# Patient Record
Sex: Female | Born: 1970
Health system: Southern US, Community
[De-identification: ages and names within clinical notes are randomized; demographics above are authoritative.]

## PROBLEM LIST (undated history)

## (undated) DIAGNOSIS — Z8619 Personal history of other infectious and parasitic diseases: Secondary | ICD-10-CM

## (undated) DIAGNOSIS — T7840XA Allergy, unspecified, initial encounter: Secondary | ICD-10-CM

## (undated) DIAGNOSIS — D649 Anemia, unspecified: Secondary | ICD-10-CM

## (undated) DIAGNOSIS — R112 Nausea with vomiting, unspecified: Secondary | ICD-10-CM

## (undated) DIAGNOSIS — Z9889 Other specified postprocedural states: Secondary | ICD-10-CM

## (undated) DIAGNOSIS — R011 Cardiac murmur, unspecified: Secondary | ICD-10-CM

## (undated) DIAGNOSIS — R569 Unspecified convulsions: Secondary | ICD-10-CM

## (undated) HISTORY — PX: OTHER SURGICAL HISTORY: SHX169

## (undated) HISTORY — DX: Allergy, unspecified, initial encounter: T78.40XA

## (undated) HISTORY — DX: Anemia, unspecified: D64.9

## (undated) HISTORY — PX: COSMETIC SURGERY: SHX468

---

## 1993-10-25 DIAGNOSIS — Z8619 Personal history of other infectious and parasitic diseases: Secondary | ICD-10-CM

## 1993-10-25 HISTORY — DX: Personal history of other infectious and parasitic diseases: Z86.19

## 2000-09-20 ENCOUNTER — Other Ambulatory Visit: Admission: RE | Admit: 2000-09-20 | Discharge: 2000-09-20 | Payer: Self-pay | Admitting: Family Medicine

## 2001-09-22 ENCOUNTER — Other Ambulatory Visit: Admission: RE | Admit: 2001-09-22 | Discharge: 2001-09-22 | Payer: Self-pay | Admitting: *Deleted

## 2004-11-13 ENCOUNTER — Other Ambulatory Visit: Admission: RE | Admit: 2004-11-13 | Discharge: 2004-11-13 | Payer: Self-pay | Admitting: Family Medicine

## 2005-01-03 ENCOUNTER — Emergency Department (HOSPITAL_COMMUNITY): Admission: EM | Admit: 2005-01-03 | Discharge: 2005-01-03 | Payer: Self-pay | Admitting: Emergency Medicine

## 2005-09-20 ENCOUNTER — Other Ambulatory Visit: Admission: RE | Admit: 2005-09-20 | Discharge: 2005-09-20 | Payer: Self-pay | Admitting: Obstetrics and Gynecology

## 2005-10-25 HISTORY — PX: PLACEMENT OF BREAST IMPLANTS: SHX6334

## 2006-04-08 ENCOUNTER — Inpatient Hospital Stay (HOSPITAL_COMMUNITY): Admission: RE | Admit: 2006-04-08 | Discharge: 2006-04-11 | Payer: Self-pay | Admitting: Obstetrics and Gynecology

## 2006-09-02 ENCOUNTER — Encounter: Admission: RE | Admit: 2006-09-02 | Discharge: 2006-09-02 | Payer: Self-pay | Admitting: Obstetrics and Gynecology

## 2007-01-05 ENCOUNTER — Emergency Department (HOSPITAL_COMMUNITY): Admission: EM | Admit: 2007-01-05 | Discharge: 2007-01-05 | Payer: Self-pay | Admitting: Emergency Medicine

## 2007-11-02 ENCOUNTER — Encounter: Admission: RE | Admit: 2007-11-02 | Discharge: 2007-11-02 | Payer: Self-pay | Admitting: Obstetrics and Gynecology

## 2009-01-06 ENCOUNTER — Encounter: Admission: RE | Admit: 2009-01-06 | Discharge: 2009-01-06 | Payer: Self-pay | Admitting: Obstetrics and Gynecology

## 2010-01-27 ENCOUNTER — Encounter: Admission: RE | Admit: 2010-01-27 | Discharge: 2010-01-27 | Payer: Self-pay | Admitting: Obstetrics and Gynecology

## 2010-06-08 ENCOUNTER — Emergency Department (HOSPITAL_COMMUNITY): Admission: EM | Admit: 2010-06-08 | Discharge: 2010-06-08 | Payer: Self-pay | Admitting: Emergency Medicine

## 2011-01-05 ENCOUNTER — Other Ambulatory Visit: Payer: Self-pay | Admitting: Obstetrics and Gynecology

## 2011-01-05 DIAGNOSIS — Z1231 Encounter for screening mammogram for malignant neoplasm of breast: Secondary | ICD-10-CM

## 2011-01-07 LAB — POCT I-STAT, CHEM 8
Hemoglobin: 13.9 g/dL (ref 12.0–15.0)
Potassium: 4.3 mEq/L (ref 3.5–5.1)

## 2011-01-29 ENCOUNTER — Ambulatory Visit: Payer: Self-pay

## 2011-02-17 ENCOUNTER — Encounter (INDEPENDENT_AMBULATORY_CARE_PROVIDER_SITE_OTHER): Payer: BC Managed Care – PPO | Admitting: Vascular Surgery

## 2011-02-17 DIAGNOSIS — I781 Nevus, non-neoplastic: Secondary | ICD-10-CM

## 2011-02-17 DIAGNOSIS — I83893 Varicose veins of bilateral lower extremities with other complications: Secondary | ICD-10-CM

## 2011-02-18 NOTE — Consult Note (Signed)
NEW PATIENT CONSULTATION  Sara [name], Sara [name] DOB:  10-01-1971                                       02/17/2011 [id number]#:[id number]  Patient presents today for evaluation of her lower extremity pathology. She is a very active, healthy 40 year old white female who was concerned regarding reticular veins over her pretibial area and also her lateral calf.  She is quite active from an exercise standpoint and does report cramping, mostly in her left calf and left foot after a workout during the day and the night of that event.  I explained that these night muscle cramps are not related to venous pathology.  She does not have any history of DVT or other difficulty.  SOCIAL HISTORY:  She is married with 1 child.  She works in Education officer, environmental. She does not smoke, has never smoked, and does have a glass of wine 3 days per week.  FAMILY HISTORY:  Negative for premature atherosclerotic disease.  REVIEW OF SYSTEMS:  No weight loss or gain.  She weighs 135 pounds.  She is 5 feet 6 inches tall.  Review of systems is otherwise completely negative.  MEDICATIONS:  Birth control pill.  ALLERGIES:  Sulfa.  PHYSICAL EXAMINATION:  A well-developed and well-nourished white female appearing stated age in no acute distress.  Blood pressure is 127/65, pulse 47, respirations 16.  HEENT:  Normal.  Her dorsalis pedis pulses are 2+ bilaterally.  Musculoskeletal shows no major deformity or cyanosis.  Neurologic:  No focal paresthesias.  Skin without ulcers or rashes.  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  She has a few scattered typical telangiectasia over her thighs.  I imaged these areas with the SonoSite for screening, and this shows a normal to small size saphenous vein with no reflux.  She does have obvious reticular veins under the area in her medial calf where she does have the  discoloration.  I discussed the significance of this with patient.  I explained that this should not pose any prolonged risk from a general health standpoint.  I did explain the treatment options for foam sclerotherapy to these areas of reticular varicosities.  I explained that we would recommend this in the fall since it would typically look worse before it looks better and would ask her to wear compression garments following this as well, which is uncomfortable in the summer time.  She understands and will contact us should she wish to proceed with treatment.    Larina Earthly, M.D. Electronically Signed  TFE/MEDQ  D:  02/17/2011  T:  02/18/2011  Job:  [id number]  cc:   Amy Y. [place], M.D. Michelle L. Vincente Poli, M.D.

## 2011-03-04 ENCOUNTER — Ambulatory Visit: Payer: Self-pay

## 2011-03-12 NOTE — Op Note (Signed)
Sara [name], Sara [name]                ACCOUNT NO.:  [id number]   MEDICAL RECORD NO.:  [id number]          PATIENT TYPE:  INP   LOCATION:  9199                          FACILITY:  [facility]   PHYSICIAN:  Michelle L. Grewal, M.D.DATE OF BIRTH:  May 09, 1971   DATE OF PROCEDURE:  04/08/2006  DATE OF DISCHARGE:                                 OPERATIVE REPORT   PREOPERATIVE DIAGNOSIS:  Intrauterine pregnancy at 39 weeks and breech  presentation.   POSTOPERATIVE DIAGNOSIS:  Intrauterine pregnancy at 39 weeks and breech  presentation.   PROCEDURE:  Primary low transverse cesarean section.   SURGEON:  Michelle L. Vincente Poli, M.D.   ANESTHESIA:  Spinal.   SPECIMENS:  Female infant.  Apgars 9 at one minute and 9 at five minutes.  Frank breech presentation.   ESTIMATED BLOOD LOSS:  500 cc.   COMPLICATIONS:  None.   DESCRIPTION OF PROCEDURE:  The patient was taken to the operating room.  She  was then given her spinal without incident.  She was prepped and draped in  the usual sterile fashion.  A Foley catheter was inserted.   A low transverse incision was made and carried down into the fascia.  The  fascia was scored in the midline and extended laterally.  The rectus muscles  were separated in the midline, and the peritoneum was entered bluntly.  The  peritoneal incision was then stretched.  The bladder blade was inserted.  The lower uterine segment was identified.  The bladder flap was created  sharply and then digitally.  The bladder blade was then readjusted.  A low  transverse incision was made in the uterus.  The uterus was entered using a  hemostat.  The baby was in frank breech presentation.  I then easily grasped  both feet and then performed a complete breech extraction without any  difficulty.  There were double nuchal arms noted.  The baby was very  vigorous on the abdomen.  It was a female infant.  Apgars were 9 at one  minute and 9 at five minutes.  The cord was clamped and cut.  The  baby was  handed to the awaiting pediatricians.  The placenta was manually removed and  noted to be normal and intact with a three-vessel cord.  The uterus was  exteriorized and cleared of all clots and debris.  The uterine incision was  closed in one layer using 0 chromic in continuous running locked stitch.  The uterus was returned to the abdomen.  Irrigation was performed.  The  peritoneum and the rectus muscles were reapproximated in the midline using 0  Vicryl.  The fascia  was closed using 0 Vicryl in continuous running stitch from one end to the  other.  After irrigation of the subcutaneous layer, the skin was closed with  staples.  All sponge, lap, and instrument counts were correct x2.   The patient went to the recovery room in stable condition.      Michelle L. Vincente Poli, M.D.  Electronically Signed     MLG/MEDQ  D:  04/08/2006  T:  04/08/2006  Job:  [id number]

## 2011-03-12 NOTE — Discharge Summary (Signed)
[name], [name]                ACCOUNT NO.:  [id number]   MEDICAL RECORD NO.:  [id number]          PATIENT TYPE:  INP   LOCATION:  9141                          FACILITY:  [facility]   PHYSICIAN:  Zelphia Cairo, MD    DATE OF BIRTH:  Dec 04, 1970   DATE OF ADMISSION:  04/08/2006  DATE OF DISCHARGE:  04/11/2006                                 DISCHARGE SUMMARY   ADMISSION DATE:  1.  Intrauterine pregnancy at 43 weeks estimated gestational age.  2.  Breech presentation.   DISCHARGE DIAGNOSES:  1.  Status post low transverse cesarean section.  2.  Viable female infant.   PROCEDURE:  Primary low transverse cesarean section.   REASON FOR ADMISSION:  Please see written H&P.   HOSPITAL COURSE:  The patient is 39 year old prima gravida that was admitted  to [facility] at 58 weeks' estimated gestational age for  scheduled cesarean section.  The patient was noted to have a fetus in the  frank breech presentation.  On the morning of admission, the patient was  taken to the operating room where spinal anesthesia was administered without  difficulty.  Low transverse incision was made with delivery of a viable  female infant weighing 7 pounds 14 ounces, Apgars of 9 at one minute and 9  at five minutes.  The patient tolerated procedure well and was taken to the  recovery room in stable condition.  On postoperative day #1, the patient was  without complaint.  Vital signs were stable.  She was afebrile.  Fundus firm  and nontender.  Abdominal dressing was noted to be clean, dry and intact.  On postoperative day #2, the patient was without complaint.  She was breast-  feeding well.  Vital signs were stable.  She was afebrile.  Fundus firm and  nontender.  Abdominal dressing had been removed revealing an incision that  is clean, dry and intact.  The patient is ambulating well and tolerating a  regular diet without complaints of nausea and vomiting. On postoperative day  #3, the  patient was without complaint.  Vital signs were stable.  She was  afebrile.  Fundus firm and nontender.  Incision was clean, dry and intact.  Discharge instructions reviewed.  Staples were removed and the patient was  later discharged home   CONDITION ON DISCHARGE:  Good.   DIET:  Regular as tolerated.   ACTIVITY:  No heavy lifting, no driving x2 weeks, no vaginal entry.   FOLLOWUP:  The patient is to follow up in the office in 1-2 weeks for an  incision check.  She is to call for temperature greater than 100 degrees,  persistent nausea and vomiting, heavy vaginal bleeding and/or redness or  drainage from incisional site.   DISCHARGE MEDICATIONS:  1.  Percocet 5/325 #30 one p.o. every 4-6 hours p.r.n.  2.  Motrin 600 mg every six hours.  3.  Prenatal vitamins one p.o. daily.  4.  Colace one p.o. daily p.r.n.      Julio Sicks, N.P.      Zelphia Cairo, MD  Electronically Signed  CC/MEDQ  D:  05/19/2006  T:  05/19/2006  Job:  [id number]

## 2011-04-19 ENCOUNTER — Ambulatory Visit
Admission: RE | Admit: 2011-04-19 | Discharge: 2011-04-19 | Disposition: A | Discharge status: Home / Self Care | Payer: BC Managed Care – PPO | Source: Ambulatory Visit | Attending: Obstetrics and Gynecology | Admitting: Obstetrics and Gynecology

## 2011-04-19 DIAGNOSIS — Z1231 Encounter for screening mammogram for malignant neoplasm of breast: Secondary | ICD-10-CM

## 2012-03-15 ENCOUNTER — Other Ambulatory Visit: Payer: Self-pay | Admitting: Obstetrics and Gynecology

## 2012-03-15 DIAGNOSIS — Z1231 Encounter for screening mammogram for malignant neoplasm of breast: Secondary | ICD-10-CM

## 2012-04-28 ENCOUNTER — Ambulatory Visit
Admission: RE | Admit: 2012-04-28 | Discharge: 2012-04-28 | Disposition: A | Discharge status: Home / Self Care | Payer: BC Managed Care – PPO | Source: Ambulatory Visit | Attending: Obstetrics and Gynecology | Admitting: Obstetrics and Gynecology

## 2012-04-28 DIAGNOSIS — Z1231 Encounter for screening mammogram for malignant neoplasm of breast: Secondary | ICD-10-CM

## 2012-05-24 ENCOUNTER — Telehealth: Payer: Self-pay | Admitting: *Deleted

## 2012-05-24 NOTE — Telephone Encounter (Signed)
Confirmed 07/17/12 genetic appt w/ pt.  Called [place] at referring to make aware.  Took paperwork to [place].

## 2012-07-17 ENCOUNTER — Encounter: Payer: Self-pay | Admitting: Genetic Counselor

## 2012-07-17 ENCOUNTER — Other Ambulatory Visit: Payer: BC Managed Care – PPO | Admitting: Lab

## 2012-07-17 ENCOUNTER — Ambulatory Visit (HOSPITAL_BASED_OUTPATIENT_CLINIC_OR_DEPARTMENT_OTHER): Payer: BC Managed Care – PPO | Admitting: Genetic Counselor

## 2012-07-17 DIAGNOSIS — Z806 Family history of leukemia: Secondary | ICD-10-CM

## 2012-07-17 DIAGNOSIS — Z800 Family history of malignant neoplasm of digestive organs: Secondary | ICD-10-CM

## 2012-07-17 DIAGNOSIS — Z8041 Family history of malignant neoplasm of ovary: Secondary | ICD-10-CM

## 2012-07-17 DIAGNOSIS — Z803 Family history of malignant neoplasm of breast: Secondary | ICD-10-CM

## 2012-07-17 NOTE — Progress Notes (Signed)
Dr.  Marcelle [name] requested a consultation for genetic counseling and risk assessment for Sara [name], a 41 y.o. female, for discussion of her family history of breast and ovarian cancer. She presents to clinic today to discuss the possibility of a genetic predisposition to cancer, and to further clarify her risks, as well as her family members' risks for cancer.   HISTORY OF PRESENT ILLNESS: Sara [name] is a 41 y.o. female with no personal history of cancer.    History reviewed. No pertinent past medical history.  History reviewed. No pertinent past surgical history.  History  Substance Use Topics  . Smoking status: Never Smoker   . Smokeless tobacco: Not on file  . Alcohol Use: Not on file    REPRODUCTIVE HISTORY AND PERSONAL RISK ASSESSMENT FACTORS: Menarche was at age 56.   Premenopausal Uterus Intact: Yes Ovaries Intact: Yes G1P1A0 , first live birth at age 69  She has not previously undergone treatment for infertility.   OCP use for 20 years   She has not used HRT in the past.    FAMILY HISTORY:  We obtained a detailed, 4-generation family history.  Significant diagnoses are listed below: Family History  Problem Relation Age of Onset  . Breast cancer Mother 74  . Leukemia Mother     from chemotherapy  . Heart disease Brother     history of drug and ETOH abuse  . Ovarian cancer Maternal Aunt 78  . Leukemia Paternal Grandmother   . Colon cancer Paternal Grandfather     diagnosed in his 7s  The patient's brother died of heart disease at age 58.  He had a hard life of drug and alcohol abuse.  Her mother was diagnosed with breast cancer at age 44, and subsequently was diagnosed with leukemia from her chemotherapy.  The patient's maternal aunt was diagnosed with ovarian cancer at age 110.  There is no other reported cancer history on this side of the family.  The patient's paternal grandmother was diagnosed with leukemia at age 46 and died at age 60.  Her paternal  grandfather was diagnosed in his 71s with colon cancer.  There is no other reported family history of cancer.  Patient's maternal ancestors are of English descent, and paternal ancestors are of unknown descent. There is no reported Ashkenazi Jewish ancestry. There is no  known consanguinity.  GENETIC COUNSELING RISK ASSESSMENT, DISCUSSION, AND SUGGESTED FOLLOW UP: We reviewed the natural history and genetic etiology of sporadic, familial and hereditary cancer syndromes.  About 5-10% of breast cancer is hereditary.  Of this, about 85% is the result of a BRCA1 or BRCA2 mutation.  We reviewed the red flags of hereditary cancer syndromes and the dominant inheritance patterns.  If the BRCA testing is negative, we discussed that we could be testing for the wrong gene.  We discussed gene panels, and that several cancer genes that are associated with different cancers can be tested at the same time.  Because of the different types of cancer that are in the patient's family, we will consider the BreastNext panel test.   The patient's family history of breast and ovarian cancer is suggestive of the following possible diagnosis: hereditary breast cancer syndrome  We discussed that identification of a hereditary cancer syndrome may help her care providers tailor the patients medical management. If a mutation indicating a hereditary cancer syndrome is detected in this case, the [employer] recommendations would include increased cancer surveillance and possible  prophylactic surgery. If a mutation is detected, the patient will be referred back to the referring provider and to any additional appropriate care providers to discuss the relevant options.   If a mutation is not found in the patient, this will not necessarily decrease the likelihood of a hereditary cancer syndrome as the explanation for her family history. Cancer surveillance options would be discussed for the patient according to  the appropriate standard National Comprehensive Cancer Network and American Cancer Society guidelines, with consideration of their personal and family history risk factors. In this case, the patient will be referred back to their care providers for discussions of management.   In order to estimate her chance of having a BRAC1 or BRCA2 mutation, we used statistical models (Penn II and tyrer cusik) and laboratory data that take into account her personal medical history, family history and ancestry.  Because each model is different, there can be a lot of variability in the risks they give.  Therefore, these numbers must be considered a rough range and not a precise risk of having a BRCA1 or BRCA2 mutation.  These models estimate that she has approximately a 0.83-3.0% chance of having a mutation.   Based on the patient's personal and family history, statistical models (tyrer cusik)  and literature data were used to estimate her risk of developing breast cancer. These estimate her lifetime risk of developing breast cancer to be approximately 13%. This estimation does not take into account any genetic testing results.   After considering the risks, benefits, and limitations, the patient provided informed consent for  the following  testing: BreastNext through [employer]   Per the patient's request, we will contact her by telephone to discuss these results. A follow up genetic counseling visit will be scheduled if indicated.  The patient was seen for a total of 60 minutes, greater than 50% of which was spent face-to-face counseling.  This plan is being carried out per Dr. Lynnell Dike recommendations.  This note will also be sent to the referring provider via the electronic medical record. The patient will be supplied with a summary of this genetic counseling discussion as well as educational information on the discussed hereditary cancer syndromes following the conclusion of their visit.   Patient was  discussed with Dr. Drue [name].  EPIC CC: Sara Overlie, MD    _______________________________________________________________________ For Office Staff:  Number of people involved in session: 3 Was an Intern/ student involved with case: yes

## 2012-08-14 ENCOUNTER — Telehealth: Payer: Self-pay | Admitting: Oncology

## 2012-08-14 NOTE — Telephone Encounter (Signed)
LVOM for pt to return call.  °

## 2012-10-10 ENCOUNTER — Telehealth: Payer: Self-pay | Admitting: Genetic Counselor

## 2012-10-10 NOTE — Telephone Encounter (Signed)
Revealed ATM VUS.  Offered family studies.  Patient declined.

## 2012-10-10 NOTE — Telephone Encounter (Signed)
Left message about test results and asked that she CB.

## 2012-10-10 NOTE — Telephone Encounter (Signed)
Mobile number is wrong number!!!!

## 2012-10-11 ENCOUNTER — Encounter: Payer: Self-pay | Admitting: Genetic Counselor

## 2013-04-02 ENCOUNTER — Other Ambulatory Visit: Payer: Self-pay

## 2013-04-02 DIAGNOSIS — Z1231 Encounter for screening mammogram for malignant neoplasm of breast: Secondary | ICD-10-CM

## 2013-05-14 ENCOUNTER — Ambulatory Visit
Admission: RE | Admit: 2013-05-14 | Discharge: 2013-05-14 | Disposition: A | Discharge status: Home / Self Care | Payer: BC Managed Care – PPO | Source: Ambulatory Visit

## 2013-05-14 DIAGNOSIS — Z1231 Encounter for screening mammogram for malignant neoplasm of breast: Secondary | ICD-10-CM

## 2014-04-09 ENCOUNTER — Ambulatory Visit (INDEPENDENT_AMBULATORY_CARE_PROVIDER_SITE_OTHER): Payer: BC Managed Care – PPO | Admitting: General Surgery

## 2014-04-09 ENCOUNTER — Other Ambulatory Visit: Payer: Self-pay

## 2014-04-09 DIAGNOSIS — Z1231 Encounter for screening mammogram for malignant neoplasm of breast: Secondary | ICD-10-CM

## 2014-04-23 ENCOUNTER — Encounter (INDEPENDENT_AMBULATORY_CARE_PROVIDER_SITE_OTHER): Payer: Self-pay | Admitting: General Surgery

## 2014-04-23 ENCOUNTER — Ambulatory Visit (INDEPENDENT_AMBULATORY_CARE_PROVIDER_SITE_OTHER): Payer: BC Managed Care – PPO | Admitting: General Surgery

## 2014-04-23 VITALS — BP 126/76 | HR 75 | Temp 98.8°F | Ht 66.0 in | Wt 125.0 lb

## 2014-04-23 DIAGNOSIS — K439 Ventral hernia without obstruction or gangrene: Secondary | ICD-10-CM

## 2014-04-23 NOTE — Progress Notes (Signed)
Patient ID: Sara [name], female   DOB: June 14, 1971, 43 y.o.   MRN: [id number]  Chief Complaint  Patient presents with  . Umbilical Hernia    HPI Sara [name] is a 43 y.o. female.  Referred by Dr Dian Queen HPI This is a 43 year old otherwise healthy female who presents as a referral for possible ventral hernia. She had a C-section 8 years ago and has noticed in the area above her umbilicus since then. She has been working out and has lost some weight recently. She now complains about a protrusion above her umbilicus when she is exercising. Interestingly this goes away while she is doing crunches. It never causes her any discomfort. Does not limit her in any of her activities. She does describe that there is sometimes up to a golf ball sized area that is present and she can see this. She has had no prior surgery except for her to see her c-section. She comes in today to be evaluated for possible ventral hernia. 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  Past Medical History  Diagnosis Date  . Anemia     Past Surgical History  Procedure Laterality Date  . Placement of breast implants    c/section, bilateral bunionectomy  Family History  Problem Relation Age of Onset  . Breast cancer Mother 80  . Leukemia Mother     from chemotherapy  . Heart disease Brother     history of drug and ETOH abuse  . Ovarian cancer Maternal Aunt 78  . Leukemia Paternal Grandmother   . Colon cancer Paternal Grandfather     diagnosed in his 57s    Social History History  Substance Use Topics  . Smoking status: Never Smoker   . Smokeless tobacco: Not on file  . Alcohol Use: 2.4 oz/week    4 Glasses of wine per week    Allergies  Allergen Reactions  . Sulfa Antibiotics     Current Outpatient Prescriptions  Medication Sig Dispense Refill  . ALPRAZolam (XANAX) 0.25 MG tablet Take 0.25 mg by mouth at bedtime as needed for anxiety.      . norgestrel-ethinyl  estradiol (LO/OVRAL,CRYSELLE) 0.3-30 MG-MCG tablet Take 1 tablet by mouth daily.       No current facility-administered medications for this visit.    Review of Systems Review of Systems  Constitutional: Negative for fever, chills and unexpected weight change.  HENT: Negative for congestion, hearing loss, sore throat, trouble swallowing and voice change.   Eyes: Negative for visual disturbance.  Respiratory: Negative for cough and wheezing.   Cardiovascular: Negative for chest pain, palpitations and leg swelling.  Gastrointestinal: Negative for nausea, vomiting, abdominal pain, diarrhea, constipation, blood in stool, abdominal distention and anal bleeding.  Genitourinary: Negative for hematuria, vaginal bleeding and difficulty urinating.  Musculoskeletal: Negative for arthralgias.  Skin: Negative for rash and wound.  Neurological: Negative for seizures, syncope and headaches.  Hematological: Negative for adenopathy. Does not bruise/bleed easily.  Psychiatric/Behavioral: Negative for confusion.    Blood pressure 126/76, pulse 75, temperature 98.8 F (37.1 C), height 5' 6"  (1.676 m), weight 125 lb (56.7 kg).  Physical Exam Physical Exam  Vitals reviewed. Constitutional: She appears well-developed and well-nourished.  Cardiovascular: Normal rate, regular rhythm and normal heart sounds.   Pulmonary/Chest: Effort normal and breath sounds normal. She has no wheezes. She has no rales.  Abdominal: Soft. Normal appearance and bowel sounds are normal. She exhibits no distension. There is tenderness.  Data Reviewed Dr Gracy Racer notes  Assessment    Possible ventral hernia     Plan    She gives a good history for a ventral hernia. However I can't really demonstrate this on her exam despite multiple maneuvers today to do so. I am pretty sure that she does have a hernia. I do not think she has a diastases. We discussed all of her options including observation, imaging to see if we  can identify a hernia or surgery with laparoscopy to identify possible hernia. We decided after a long conversation to proceed with a CT scan to see if we can identify a hernia. She understands there might be some limitations to this test. I will plan on calling her after obtaining the CT scan and we will proceed from there. I told her there was a hernia we could fix this sometime in the future.        WAKEFIELD,MATTHEW 04/23/2014, 5:11 PM

## 2014-05-13 ENCOUNTER — Ambulatory Visit
Admission: RE | Admit: 2014-05-13 | Discharge: 2014-05-13 | Disposition: A | Discharge status: Home / Self Care | Payer: BC Managed Care – PPO | Source: Ambulatory Visit | Attending: General Surgery | Admitting: General Surgery

## 2014-05-13 DIAGNOSIS — K439 Ventral hernia without obstruction or gangrene: Secondary | ICD-10-CM

## 2014-05-13 MED ORDER — IOHEXOL 300 MG/ML  SOLN
100.0000 mL | Freq: Once | INTRAMUSCULAR | Status: AC | PRN
  Administered 2014-05-13: 100 mL via INTRAVENOUS

## 2014-05-14 ENCOUNTER — Telehealth (INDEPENDENT_AMBULATORY_CARE_PROVIDER_SITE_OTHER): Payer: Self-pay | Admitting: General Surgery

## 2014-05-14 NOTE — Telephone Encounter (Signed)
Has a small supraumbilical hernia on ct scan.  We discussed and I recommended a primary repair.  She is going to discuss with her husband and would like to have this fixed in next few months.  She will call back to make another appt.  If gets worse or more symptoms she will call sooner

## 2014-05-15 ENCOUNTER — Ambulatory Visit
Admission: RE | Admit: 2014-05-15 | Discharge: 2014-05-15 | Disposition: A | Discharge status: Home / Self Care | Payer: BC Managed Care – PPO | Source: Ambulatory Visit

## 2014-05-15 DIAGNOSIS — Z1231 Encounter for screening mammogram for malignant neoplasm of breast: Secondary | ICD-10-CM

## 2014-05-24 ENCOUNTER — Telehealth (INDEPENDENT_AMBULATORY_CARE_PROVIDER_SITE_OTHER): Payer: Self-pay | Admitting: General Surgery

## 2014-05-24 NOTE — Telephone Encounter (Signed)
Patient called in explaining that she would like to wait until December to schedule her surgery.  Preferably she would like to be scheduled on 10/21/14.  Informed her that the OR schedule isn't completed that far out.  Informed her that she will need another appt here in the office before the surgery in December.  She has one in October and I informed her that it would be Dr. Cristal Generous discretion on whether or not it needs to be closer to December or not.  Informed her that I would send this message around to him and his assistant, Lars Mage, to make them aware.  She explained she is also planning to continue with crossfit and weight training.  Informed her that if something hurts then she should avoid doing it.  Educated on bracing the abdomen on heavy lifting exercises and educated her on making sure they hernia can be reducible.  Informed her to call the office if it becomes non-reducible, starts having problems with her bowels, or severe N/V. Patient verbalized understanding.

## 2014-05-26 NOTE — Telephone Encounter (Signed)
She can see me one month before.  She cannot do crossfit or anything strenuous for at least 2 maybe 3 weeks.  Otherwise there is risk of failure

## 2014-05-30 ENCOUNTER — Other Ambulatory Visit: Payer: Self-pay | Admitting: Obstetrics and Gynecology

## 2014-05-30 DIAGNOSIS — Z803 Family history of malignant neoplasm of breast: Secondary | ICD-10-CM

## 2014-06-09 ENCOUNTER — Other Ambulatory Visit: Payer: BC Managed Care – PPO

## 2014-06-20 ENCOUNTER — Other Ambulatory Visit (INDEPENDENT_AMBULATORY_CARE_PROVIDER_SITE_OTHER): Payer: Self-pay | Admitting: General Surgery

## 2014-06-20 NOTE — Telephone Encounter (Signed)
Surgical orders completed and turned into coding for schedulers to call Sara [name]. I need the Sara [name] to make an appt to see Dr Donne Hazel one month before her surgery date. The Sara [name] is requesting for her surgery to be scheduled in December.

## 2014-06-26 ENCOUNTER — Ambulatory Visit
Admission: RE | Admit: 2014-06-26 | Discharge: 2014-06-26 | Disposition: A | Discharge status: Home / Self Care | Payer: BC Managed Care – PPO | Source: Ambulatory Visit | Attending: Obstetrics and Gynecology | Admitting: Obstetrics and Gynecology

## 2014-06-26 DIAGNOSIS — Z803 Family history of malignant neoplasm of breast: Secondary | ICD-10-CM

## 2014-06-26 MED ORDER — GADOBENATE DIMEGLUMINE 529 MG/ML IV SOLN
11.0000 mL | Freq: Once | INTRAVENOUS | Status: AC | PRN
  Administered 2014-06-26: 11 mL via INTRAVENOUS

## 2014-07-15 ENCOUNTER — Other Ambulatory Visit (INDEPENDENT_AMBULATORY_CARE_PROVIDER_SITE_OTHER): Payer: Self-pay | Admitting: General Surgery

## 2014-07-15 ENCOUNTER — Ambulatory Visit (INDEPENDENT_AMBULATORY_CARE_PROVIDER_SITE_OTHER): Payer: BC Managed Care – PPO | Admitting: General Surgery

## 2014-07-30 ENCOUNTER — Ambulatory Visit (INDEPENDENT_AMBULATORY_CARE_PROVIDER_SITE_OTHER): Payer: BC Managed Care – PPO | Admitting: General Surgery

## 2014-08-20 ENCOUNTER — Ambulatory Visit (INDEPENDENT_AMBULATORY_CARE_PROVIDER_SITE_OTHER): Payer: BC Managed Care – PPO | Admitting: General Surgery

## 2014-08-26 ENCOUNTER — Encounter (HOSPITAL_COMMUNITY): Payer: Self-pay

## 2014-08-28 ENCOUNTER — Encounter (HOSPITAL_COMMUNITY): Payer: Self-pay

## 2014-08-28 ENCOUNTER — Encounter (HOSPITAL_COMMUNITY)
Admission: RE | Admit: 2014-08-28 | Discharge: 2014-08-28 | Disposition: A | Discharge status: Home / Self Care | Payer: BC Managed Care – PPO | Source: Ambulatory Visit | Attending: General Surgery | Admitting: General Surgery

## 2014-08-28 DIAGNOSIS — K439 Ventral hernia without obstruction or gangrene: Secondary | ICD-10-CM | POA: Diagnosis not present

## 2014-08-28 DIAGNOSIS — Z01812 Encounter for preprocedural laboratory examination: Secondary | ICD-10-CM | POA: Insufficient documentation

## 2014-08-28 HISTORY — DX: Other specified postprocedural states: Z98.890

## 2014-08-28 HISTORY — DX: Unspecified convulsions: R56.9

## 2014-08-28 HISTORY — DX: Personal history of other infectious and parasitic diseases: Z86.19

## 2014-08-28 HISTORY — DX: Cardiac murmur, unspecified: R01.1

## 2014-08-28 HISTORY — DX: Nausea with vomiting, unspecified: R11.2

## 2014-08-28 LAB — CBC WITH DIFFERENTIAL/PLATELET
Basophils Absolute: 0.1 10*3/uL (ref 0.0–0.1)
Basophils Relative: 1 % (ref 0–1)
EOS ABS: 0.1 10*3/uL (ref 0.0–0.7)
Eosinophils Relative: 1 % (ref 0–5)
HCT: 35.4 % — ABNORMAL LOW (ref 36.0–46.0)
Hemoglobin: 11.6 g/dL — ABNORMAL LOW (ref 12.0–15.0)
LYMPHS ABS: 2.2 10*3/uL (ref 0.7–4.0)
Lymphocytes Relative: 38 % (ref 12–46)
MCH: 24 pg — AB (ref 26.0–34.0)
MCHC: 32.8 g/dL (ref 30.0–36.0)
MCV: 73.1 fL — AB (ref 78.0–100.0)
Monocytes Absolute: 0.4 10*3/uL (ref 0.1–1.0)
Monocytes Relative: 6 % (ref 3–12)
NEUTROS PCT: 54 % (ref 43–77)
Neutro Abs: 3.2 10*3/uL (ref 1.7–7.7)
Platelets: 208 10*3/uL (ref 150–400)
RBC: 4.84 MIL/uL (ref 3.87–5.11)
RDW: 16.9 % — ABNORMAL HIGH (ref 11.5–15.5)
WBC: 5.9 10*3/uL (ref 4.0–10.5)

## 2014-08-28 LAB — COMPREHENSIVE METABOLIC PANEL
ALBUMIN: 3.7 g/dL (ref 3.5–5.2)
ALK PHOS: 60 U/L (ref 39–117)
ALT: 20 U/L (ref 0–35)
AST: 21 U/L (ref 0–37)
Anion gap: 13 (ref 5–15)
BUN: 17 mg/dL (ref 6–23)
CO2: 22 mEq/L (ref 19–32)
Calcium: 8.6 mg/dL (ref 8.4–10.5)
Chloride: 102 mEq/L (ref 96–112)
Creatinine, Ser: 0.78 mg/dL (ref 0.50–1.10)
GFR calc Af Amer: >90 mL/min (ref >90)
GFR calc non Af Amer: >90 mL/min (ref >90)
GLUCOSE: 88 mg/dL (ref 70–99)
POTASSIUM: 4 meq/L (ref 3.7–5.3)
SODIUM: 137 meq/L (ref 137–147)
Total Bilirubin: 0.4 mg/dL (ref 0.3–1.2)
Total Protein: 6.6 g/dL (ref 6.0–8.3)

## 2014-08-28 LAB — URINALYSIS, ROUTINE W REFLEX MICROSCOPIC
BILIRUBIN URINE: NEGATIVE
GLUCOSE, UA: NEGATIVE mg/dL
HGB URINE DIPSTICK: NEGATIVE
Ketones, ur: NEGATIVE mg/dL
Leukocytes, UA: NEGATIVE
Nitrite: NEGATIVE
PH: 6 (ref 5.0–8.0)
Protein, ur: NEGATIVE mg/dL
SPECIFIC GRAVITY, URINE: 1.015 (ref 1.005–1.030)
UROBILINOGEN UA: 0.2 mg/dL (ref 0.0–1.0)

## 2014-08-28 LAB — HCG, SERUM, QUALITATIVE: Preg, Serum: NEGATIVE

## 2014-08-28 NOTE — Progress Notes (Addendum)
Medical Md is Dr.Michelle Grewal  Pt doesn't have a cardiologist  Denies ekg or cxr in past yr  Echo done in the early 90's but denies stress test or heart cath

## 2014-08-28 NOTE — Pre-Procedure Instructions (Signed)
Sara [name]  08/28/2014   Your procedure is scheduled on:  Thurs, Nov 12 @ 12:00 PM  Report to [facility]  at 10:00 AM.  Call this number if you have problems the morning of surgery: [phone]   Remember:   Do not eat food or drink liquids after midnight.   Take these medicines the morning of surgery with A SIP OF WATER: Alprazolam(Xanax)              No Goody's,BC's,Aleve,Aspirin,Ibuprofen,Fish Oil,or any Herbal Medications   Do not wear jewelry, make-up or nail polish.  Do not wear lotions, powders, or perfumes. You may wear deodorant.  Do not shave 48 hours prior to surgery.   Do not bring valuables to the hospital.  [facility] is not responsible                  for any belongings or valuables.               Contacts, dentures or bridgework may not be worn into surgery.  Leave suitcase in the car. After surgery it may be brought to your room.  For patients admitted to the hospital, discharge time is determined by your                treatment team.               Patients discharged the day of surgery will not be allowed to drive  home.    Special Instructions:  [place] - Preparing for Surgery  Before surgery, you can play an important role.  Because skin is not sterile, your skin needs to be as free of germs as possible.  You can reduce the number of germs on you skin by washing with CHG (chlorahexidine gluconate) soap before surgery.  CHG is an antiseptic cleaner which kills germs and bonds with the skin to continue killing germs even after washing.  Please DO NOT use if you have an allergy to CHG or antibacterial soaps.  If your skin becomes reddened/irritated stop using the CHG and inform your nurse when you arrive at Short Stay.  Do not shave (including legs and underarms) for at least 48 hours prior to the first CHG shower.  You may shave your face.  Please follow these instructions carefully:   1.  Shower with CHG Soap the night before surgery and the                                 morning of Surgery.  2.  If you choose to wash your hair, wash your hair first as usual with your       normal shampoo.  3.  After you shampoo, rinse your hair and body thoroughly to remove the                      Shampoo.  4.  Use CHG as you would any other liquid soap.  You can apply chg directly       to the skin and wash gently with scrungie or a clean washcloth.  5.  Apply the CHG Soap to your body ONLY FROM THE NECK DOWN.        Do not use on open wounds or open sores.  Avoid contact with your eyes,       ears, mouth and genitals (private parts).  Wash genitals (  private parts)       with your normal soap.  6.  Wash thoroughly, paying special attention to the area where your surgery        will be performed.  7.  Thoroughly rinse your body with warm water from the neck down.  8.  DO NOT shower/wash with your normal soap after using and rinsing off       the CHG Soap.  9.  Pat yourself dry with a clean towel.            10.  Wear clean pajamas.            11.  Place clean sheets on your bed the night of your first shower and do not        sleep with pets.  Day of Surgery  Do not apply any lotions/deoderants the morning of surgery.  Please wear clean clothes to the hospital/surgery center.     Please read over the following fact sheets that you were given: Pain Booklet, Coughing and Deep Breathing and Surgical Site Infection Prevention

## 2014-09-04 MED ORDER — CEFAZOLIN SODIUM-DEXTROSE 2-3 GM-% IV SOLR
2.0000 g | INTRAVENOUS | Status: AC
Start: 2014-09-05 — End: 2014-09-05
  Administered 2014-09-05: 2 g via INTRAVENOUS
  Filled 2014-09-04: qty 50

## 2014-09-05 ENCOUNTER — Ambulatory Visit (HOSPITAL_COMMUNITY): Payer: BC Managed Care – PPO | Admitting: Certified Registered Nurse Anesthetist

## 2014-09-05 ENCOUNTER — Encounter (HOSPITAL_COMMUNITY)
Admission: RE | Disposition: A | Discharge status: Home / Self Care | Payer: Self-pay | Source: Ambulatory Visit | Attending: General Surgery

## 2014-09-05 ENCOUNTER — Encounter (HOSPITAL_COMMUNITY): Payer: Self-pay | Admitting: Certified Registered Nurse Anesthetist

## 2014-09-05 ENCOUNTER — Ambulatory Visit (HOSPITAL_COMMUNITY)
Admission: RE | Admit: 2014-09-05 | Discharge: 2014-09-05 | Disposition: A | Discharge status: Home / Self Care | Payer: BC Managed Care – PPO | Source: Ambulatory Visit | Attending: General Surgery | Admitting: General Surgery

## 2014-09-05 DIAGNOSIS — Z800 Family history of malignant neoplasm of digestive organs: Secondary | ICD-10-CM | POA: Insufficient documentation

## 2014-09-05 DIAGNOSIS — Z8249 Family history of ischemic heart disease and other diseases of the circulatory system: Secondary | ICD-10-CM | POA: Diagnosis not present

## 2014-09-05 DIAGNOSIS — Z803 Family history of malignant neoplasm of breast: Secondary | ICD-10-CM | POA: Insufficient documentation

## 2014-09-05 DIAGNOSIS — Z793 Long term (current) use of hormonal contraceptives: Secondary | ICD-10-CM | POA: Insufficient documentation

## 2014-09-05 DIAGNOSIS — Z8041 Family history of malignant neoplasm of ovary: Secondary | ICD-10-CM | POA: Diagnosis not present

## 2014-09-05 DIAGNOSIS — K429 Umbilical hernia without obstruction or gangrene: Secondary | ICD-10-CM | POA: Diagnosis not present

## 2014-09-05 HISTORY — PX: VENTRAL HERNIA REPAIR: SHX424

## 2014-09-05 SURGERY — REPAIR, HERNIA, VENTRAL
Anesthesia: General | Site: Abdomen

## 2014-09-05 MED ORDER — LIDOCAINE HCL (CARDIAC) 20 MG/ML IV SOLN
INTRAVENOUS | Status: DC | PRN
  Administered 2014-09-05: 20 mg via INTRAVENOUS

## 2014-09-05 MED ORDER — NEOSTIGMINE METHYLSULFATE 10 MG/10ML IV SOLN
INTRAVENOUS | Status: DC | PRN
  Administered 2014-09-05: 3 mg via INTRAVENOUS

## 2014-09-05 MED ORDER — DEXAMETHASONE SODIUM PHOSPHATE 4 MG/ML IJ SOLN
INTRAMUSCULAR | Status: DC | PRN
  Administered 2014-09-05: 8 mg via INTRAVENOUS

## 2014-09-05 MED ORDER — DIPHENHYDRAMINE HCL 50 MG/ML IJ SOLN: 10.0000 mg | Freq: Once | INTRAMUSCULAR | Status: DC

## 2014-09-05 MED ORDER — OXYCODONE HCL 5 MG PO TABS
ORAL_TABLET | ORAL | Status: AC
  Administered 2014-09-05: 5 mg via ORAL
  Filled 2014-09-05: qty 1

## 2014-09-05 MED ORDER — ARTIFICIAL TEARS OP OINT
TOPICAL_OINTMENT | OPHTHALMIC | Status: AC
  Filled 2014-09-05: qty 3.5

## 2014-09-05 MED ORDER — MIDAZOLAM HCL 2 MG/2ML IJ SOLN
INTRAMUSCULAR | Status: AC
  Filled 2014-09-05: qty 2

## 2014-09-05 MED ORDER — EPHEDRINE SULFATE 50 MG/ML IJ SOLN
INTRAMUSCULAR | Status: AC
  Filled 2014-09-05: qty 1

## 2014-09-05 MED ORDER — BUPIVACAINE HCL (PF) 0.25 % IJ SOLN
INTRAMUSCULAR | Status: DC | PRN
  Administered 2014-09-05: 30 mL

## 2014-09-05 MED ORDER — LACTATED RINGERS IV SOLN
INTRAVENOUS | Status: DC
  Administered 2014-09-05: 10:00:00 via INTRAVENOUS

## 2014-09-05 MED ORDER — DIPHENHYDRAMINE HCL 50 MG/ML IJ SOLN
INTRAMUSCULAR | Status: DC | PRN
  Administered 2014-09-05: 10 mg via INTRAVENOUS

## 2014-09-05 MED ORDER — LIDOCAINE HCL 4 % MT SOLN
OROMUCOSAL | Status: DC | PRN
  Administered 2014-09-05: 4 mL via TOPICAL

## 2014-09-05 MED ORDER — GLYCOPYRROLATE 0.2 MG/ML IJ SOLN
INTRAMUSCULAR | Status: DC | PRN
  Administered 2014-09-05: 0.4 mg via INTRAVENOUS

## 2014-09-05 MED ORDER — HYDROMORPHONE HCL 1 MG/ML IJ SOLN
INTRAMUSCULAR | Status: AC
  Filled 2014-09-05: qty 1

## 2014-09-05 MED ORDER — LIDOCAINE HCL (CARDIAC) 20 MG/ML IV SOLN
INTRAVENOUS | Status: AC
  Filled 2014-09-05: qty 10

## 2014-09-05 MED ORDER — FENTANYL CITRATE 0.05 MG/ML IJ SOLN
INTRAMUSCULAR | Status: AC
  Filled 2014-09-05: qty 5

## 2014-09-05 MED ORDER — PROMETHAZINE HCL 25 MG/ML IJ SOLN: 6.2500 mg | INTRAMUSCULAR | Status: DC | PRN

## 2014-09-05 MED ORDER — LACTATED RINGERS IV SOLN
INTRAVENOUS | Status: DC | PRN
  Administered 2014-09-05 (×2): via INTRAVENOUS

## 2014-09-05 MED ORDER — HYDROMORPHONE HCL 1 MG/ML IJ SOLN
INTRAMUSCULAR | Status: AC
  Administered 2014-09-05: 0.5 mg via INTRAVENOUS
  Filled 2014-09-05: qty 1

## 2014-09-05 MED ORDER — DIPHENHYDRAMINE HCL 50 MG/ML IJ SOLN
INTRAMUSCULAR | Status: AC
  Filled 2014-09-05: qty 1

## 2014-09-05 MED ORDER — HYDROMORPHONE HCL 1 MG/ML IJ SOLN
0.2500 mg | INTRAMUSCULAR | Status: DC | PRN
  Administered 2014-09-05 (×3): 0.5 mg via INTRAVENOUS

## 2014-09-05 MED ORDER — SCOPOLAMINE 1 MG/3DAYS TD PT72
MEDICATED_PATCH | TRANSDERMAL | Status: AC
  Filled 2014-09-05: qty 1

## 2014-09-05 MED ORDER — ONDANSETRON HCL 4 MG/2ML IJ SOLN
INTRAMUSCULAR | Status: AC
  Filled 2014-09-05: qty 2

## 2014-09-05 MED ORDER — SODIUM CHLORIDE 0.9 % IR SOLN
Status: DC | PRN
  Administered 2014-09-05: 1000 mL

## 2014-09-05 MED ORDER — GLYCOPYRROLATE 0.2 MG/ML IJ SOLN
INTRAMUSCULAR | Status: AC
  Filled 2014-09-05: qty 2

## 2014-09-05 MED ORDER — MIDAZOLAM HCL 5 MG/5ML IJ SOLN
INTRAMUSCULAR | Status: DC | PRN
  Administered 2014-09-05: 2 mg via INTRAVENOUS

## 2014-09-05 MED ORDER — ROCURONIUM BROMIDE 100 MG/10ML IV SOLN
INTRAVENOUS | Status: DC | PRN
  Administered 2014-09-05: 35 mg via INTRAVENOUS

## 2014-09-05 MED ORDER — ROCURONIUM BROMIDE 50 MG/5ML IV SOLN
INTRAVENOUS | Status: AC
  Filled 2014-09-05: qty 1

## 2014-09-05 MED ORDER — OXYCODONE-ACETAMINOPHEN 10-325 MG PO TABS: 1.0000 | ORAL_TABLET | Freq: Four times a day (QID) | ORAL | Status: AC | PRN

## 2014-09-05 MED ORDER — MIDAZOLAM HCL 2 MG/2ML IJ SOLN: 0.5000 mg | Freq: Once | INTRAMUSCULAR | Status: DC | PRN

## 2014-09-05 MED ORDER — OXYCODONE HCL 5 MG/5ML PO SOLN: 5.0000 mg | Freq: Once | ORAL | Status: AC | PRN

## 2014-09-05 MED ORDER — FENTANYL CITRATE 0.05 MG/ML IJ SOLN
INTRAMUSCULAR | Status: DC | PRN
  Administered 2014-09-05: 50 ug via INTRAVENOUS
  Administered 2014-09-05: 150 ug via INTRAVENOUS

## 2014-09-05 MED ORDER — PROMETHAZINE HCL 12.5 MG PO TABS: 12.5000 mg | ORAL_TABLET | Freq: Four times a day (QID) | ORAL | Status: DC | PRN

## 2014-09-05 MED ORDER — OXYCODONE HCL 5 MG PO TABS: 5.0000 mg | ORAL_TABLET | ORAL | Status: DC | PRN

## 2014-09-05 MED ORDER — STERILE WATER FOR INJECTION IJ SOLN
INTRAMUSCULAR | Status: AC
  Filled 2014-09-05: qty 10

## 2014-09-05 MED ORDER — EPHEDRINE SULFATE 50 MG/ML IJ SOLN
INTRAMUSCULAR | Status: DC | PRN
  Administered 2014-09-05: 5 mg via INTRAVENOUS
  Administered 2014-09-05: 10 mg via INTRAVENOUS

## 2014-09-05 MED ORDER — PROPOFOL 10 MG/ML IV BOLUS
INTRAVENOUS | Status: DC | PRN
  Administered 2014-09-05: 200 mg via INTRAVENOUS

## 2014-09-05 MED ORDER — BUPIVACAINE HCL (PF) 0.25 % IJ SOLN
INTRAMUSCULAR | Status: AC
  Filled 2014-09-05: qty 30

## 2014-09-05 MED ORDER — ARTIFICIAL TEARS OP OINT
TOPICAL_OINTMENT | OPHTHALMIC | Status: DC | PRN
  Administered 2014-09-05: 1 via OPHTHALMIC

## 2014-09-05 MED ORDER — OXYCODONE HCL 5 MG PO TABS
5.0000 mg | ORAL_TABLET | Freq: Once | ORAL | Status: AC | PRN
  Administered 2014-09-05: 5 mg via ORAL

## 2014-09-05 MED ORDER — MEPERIDINE HCL 25 MG/ML IJ SOLN: 6.2500 mg | INTRAMUSCULAR | Status: DC | PRN

## 2014-09-05 MED ORDER — ONDANSETRON HCL 4 MG/2ML IJ SOLN
INTRAMUSCULAR | Status: DC | PRN
Start: 2014-09-05 — End: 2014-09-05
  Administered 2014-09-05: 4 mg via INTRAVENOUS

## 2014-09-05 MED ORDER — KETOROLAC TROMETHAMINE 15 MG/ML IJ SOLN: 15.0000 mg | Freq: Four times a day (QID) | INTRAMUSCULAR | Status: DC

## 2014-09-05 SURGICAL SUPPLY — 61 items
APPLIER CLIP 5 13 M/L LIGAMAX5 (MISCELLANEOUS)
APPLIER CLIP ROT 10 11.4 M/L (STAPLE)
BINDER ABD UNIV 12 45-62 (WOUND CARE) ×2 IMPLANT
BINDER ABDOMINAL 46IN 62IN (WOUND CARE) ×4
BNDG GAUZE ELAST 4 BULKY (GAUZE/BANDAGES/DRESSINGS) IMPLANT
CANISTER SUCTION 2500CC (MISCELLANEOUS) IMPLANT
CHLORAPREP W/TINT 26ML (MISCELLANEOUS) ×4 IMPLANT
CLIP APPLIE 5 13 M/L LIGAMAX5 (MISCELLANEOUS) IMPLANT
CLIP APPLIE ROT 10 11.4 M/L (STAPLE) IMPLANT
CLOSURE WOUND 1/2 X4 (GAUZE/BANDAGES/DRESSINGS) ×1
COVER SURGICAL LIGHT HANDLE (MISCELLANEOUS) ×4 IMPLANT
DERMABOND ADHESIVE PROPEN (GAUZE/BANDAGES/DRESSINGS) ×2
DERMABOND ADVANCED (GAUZE/BANDAGES/DRESSINGS) ×2
DERMABOND ADVANCED .7 DNX12 (GAUZE/BANDAGES/DRESSINGS) ×2 IMPLANT
DERMABOND ADVANCED .7 DNX6 (GAUZE/BANDAGES/DRESSINGS) ×2 IMPLANT
DEVICE RELIATACK FIXATION (MISCELLANEOUS) ×4 IMPLANT
DEVICE TROCAR PUNCTURE CLOSURE (ENDOMECHANICALS) ×4 IMPLANT
DRAPE INCISE IOBAN 66X45 STRL (DRAPES) ×4 IMPLANT
DRAPE LAPAROSCOPIC ABDOMINAL (DRAPES) ×4 IMPLANT
DRAPE WARM FLUID 44X44 (DRAPE) ×4 IMPLANT
DRSG OPSITE POSTOP 3X4 (GAUZE/BANDAGES/DRESSINGS) ×4 IMPLANT
ELECT REM PT RETURN 9FT ADLT (ELECTROSURGICAL) ×4
ELECTRODE REM PT RTRN 9FT ADLT (ELECTROSURGICAL) ×2 IMPLANT
GAUZE SPONGE 2X2 8PLY NS (GAUZE/BANDAGES/DRESSINGS) ×4 IMPLANT
GAUZE SPONGE 2X2 8PLY STRL LF (GAUZE/BANDAGES/DRESSINGS) ×2 IMPLANT
GLOVE BIO SURGEON STRL SZ7 (GLOVE) ×8 IMPLANT
GLOVE BIOGEL PI IND STRL 6.5 (GLOVE) ×2 IMPLANT
GLOVE BIOGEL PI IND STRL 7.5 (GLOVE) ×2 IMPLANT
GLOVE BIOGEL PI INDICATOR 6.5 (GLOVE) ×2
GLOVE BIOGEL PI INDICATOR 7.5 (GLOVE) ×2
GLOVE SURG SS PI 6.5 STRL IVOR (GLOVE) ×4 IMPLANT
GOWN STRL REUS W/ TWL LRG LVL3 (GOWN DISPOSABLE) ×6 IMPLANT
GOWN STRL REUS W/TWL LRG LVL3 (GOWN DISPOSABLE) ×6
KIT BASIN OR (CUSTOM PROCEDURE TRAY) ×4 IMPLANT
KIT ROOM TURNOVER OR (KITS) ×4 IMPLANT
MARKER SKIN DUAL TIP RULER LAB (MISCELLANEOUS) ×4 IMPLANT
NEEDLE SPNL 22GX3.5 QUINCKE BK (NEEDLE) ×4 IMPLANT
NS IRRIG 1000ML POUR BTL (IV SOLUTION) ×8 IMPLANT
PAD ARMBOARD 7.5X6 YLW CONV (MISCELLANEOUS) ×8 IMPLANT
PEN SKIN MARKING BROAD (MISCELLANEOUS) ×4 IMPLANT
RELOAD RELIATACK 10 (MISCELLANEOUS) IMPLANT
RELOAD RELIATACK 5 (MISCELLANEOUS) IMPLANT
SCALPEL HARMONIC ACE (MISCELLANEOUS) IMPLANT
SCISSORS LAP 5X35 DISP (ENDOMECHANICALS) IMPLANT
SET IRRIG TUBING LAPAROSCOPIC (IRRIGATION / IRRIGATOR) IMPLANT
SLEEVE ENDOPATH XCEL 5M (ENDOMECHANICALS) ×4 IMPLANT
SPONGE GAUZE 2X2 STER 10/PKG (GAUZE/BANDAGES/DRESSINGS) ×2
STRIP CLOSURE SKIN 1/2X4 (GAUZE/BANDAGES/DRESSINGS) ×3 IMPLANT
SUT MNCRL AB 4-0 PS2 18 (SUTURE) ×4 IMPLANT
SUT NOVA NAB DX-16 0-1 5-0 T12 (SUTURE) ×8 IMPLANT
SUT PROLENE 0 CT 1 CR/8 (SUTURE) ×4 IMPLANT
SUT VIC AB 3-0 PS2 18 (SUTURE) ×2
SUT VIC AB 3-0 PS2 18XBRD (SUTURE) ×2 IMPLANT
TACKER 5MM HERNIA 3.5CML NAB (ENDOMECHANICALS) IMPLANT
TOWEL OR 17X24 6PK STRL BLUE (TOWEL DISPOSABLE) ×4 IMPLANT
TOWEL OR 17X26 10 PK STRL BLUE (TOWEL DISPOSABLE) ×4 IMPLANT
TRAY LAPAROSCOPIC (CUSTOM PROCEDURE TRAY) ×4 IMPLANT
TROCAR XCEL BLUNT TIP 100MML (ENDOMECHANICALS) IMPLANT
TROCAR XCEL NON-BLD 11X100MML (ENDOMECHANICALS) ×4 IMPLANT
TROCAR XCEL NON-BLD 5MMX100MML (ENDOMECHANICALS) ×4 IMPLANT
TUBING INSUFFLATION (TUBING) ×4 IMPLANT

## 2014-09-05 NOTE — H&P (Signed)
The patient is a 43 year old female who presents with an umbilical hernia.  Additional reasons for visit:  Hernia is described as the following: 64 yof otherwise healthy who I previously saw in June for possible ventral hernia. This was difficult to feel at that time. I sent her for ct which report states there is no significant ventral hernia but on my review of scan she appears to have a very small supraumbilical hernia. Today she returns with some more pain at that site especially while exercising that has now started limiting her exercise. She has very strenuous exercise routine. this area does bulge at times still. She comes in with her husband today to discuss options.   Past Surgical History Lars Mage [place], [place]; 07/15/2014 4:05 PM) Breast Augmentation Bilateral. Cesarean Section - 1 Foot Surgery Bilateral. Oral Surgery  Diagnostic Studies History Lars Mage [place], [place]; 07/15/2014 4:05 PM) Colonoscopy never Mammogram within last year Pap Smear 1-5 years ago  Allergies Illene Reg[place], [place]; 07/15/2014 4:08 PM) Sulfacetamide *CHEMICALS*  Medication History (Alisha Spillers, [place]; 07/15/2014 4:09 PM) ALPRAZolam (0.25MG Tablet, Oral as needed) Active. Cryselle-28 (0.3-30MG-MCG Tablet, Oral daily) Active. Medications Reconciled  Social History Lars Mage [place], [place]; 07/15/2014 4:05 PM) Alcohol use Occasional alcohol use. Caffeine use Coffee, Tea. No drug use Tobacco use Never smoker.  Family History Lars Mage [place], [place]; 07/15/2014 4:05 PM) Breast Cancer Mother. Cancer Family Members In General. Colon Cancer Family Members In General. Heart Disease Brother. Ovarian Cancer Family Members In General. Thyroid problems Mother.  Pregnancy / Birth History Lars Mage [place], [place]; 07/15/2014 4:05 PM) Age at menarche 11 years. Contraceptive History Oral contraceptives. Gravida 1 Maternal age 59-35 Para 1 Regular periods  Review of Systems Lars Mage [place];  07/15/2014 4:05 PM) General Not Present- Appetite Loss, Chills, Fatigue, Fever, Night Sweats, Weight Gain and Weight Loss. Skin Not Present- Change in Wart/Mole, Dryness, Hives, Jaundice, New Lesions, Non-Healing Wounds, Rash and Ulcer. HEENT Not Present- Earache, Hearing Loss, Hoarseness, Nose Bleed, Oral Ulcers, Ringing in the Ears, Seasonal Allergies, Sinus Pain, Sore Throat, Visual Disturbances, Wears glasses/contact lenses and Yellow Eyes. Respiratory Not Present- Bloody sputum, Chronic Cough, Difficulty Breathing, Snoring and Wheezing. Breast Not Present- Breast Mass, Breast Pain, Nipple Discharge and Skin Changes. Cardiovascular Not Present- Chest Pain, Difficulty Breathing Lying Down, Leg Cramps, Palpitations, Rapid Heart Rate, Shortness of Breath and Swelling of Extremities. Gastrointestinal Not Present- Abdominal Pain, Bloating, Bloody Stool, Change in Bowel Habits, Chronic diarrhea, Constipation, Difficulty Swallowing, Excessive gas, Gets full quickly at meals, Hemorrhoids, Indigestion, Nausea, Rectal Pain and Vomiting. Female Genitourinary Not Present- Frequency, Nocturia, Painful Urination, Pelvic Pain and Urgency. Musculoskeletal Not Present- Back Pain, Joint Pain, Joint Stiffness, Muscle Pain, Muscle Weakness and Swelling of Extremities. Neurological Not Present- Decreased Memory, Fainting, Headaches, Numbness, Seizures, Tingling, Tremor, Trouble walking and Weakness. Psychiatric Not Present- Anxiety, Bipolar, Change in Sleep Pattern, Depression, Fearful and Frequent crying. Hematology Not Present- Easy Bruising, Excessive bleeding, Gland problems, HIV and Persistent Infections.   Vitals (Alisha Spillers MA; 07/15/2014 4:06 PM) 07/15/2014 4:06 PM Weight: 128 lb Height: 66in Body Surface Area: 1.64 m Body Mass Index: 20.66 kg/m BP: 102/60 (Sitting, Left Arm, Standard)    Physical Exam Rolm Bookbinder MD; 07/15/2014 9:44 PM) Abdomen Inspection Hernias - Umbilical hernia  - Note: mildly tender supraumbilcal hernia reduced today.    Assessment & Plan Rolm Bookbinder MD; 07/15/2014 9:46 PM) VENTRAL HERNIA (553.20  K43.9) Impression: she does appear clinically and radiologically today to have a small supraumbilical hernia. I discussed that due  to symptoms and limiting her now I would recommend repair. this is primary hernia. I discussed possibility of diagnostic laparoscopy to definitively identify this area with primary repair. I discussed rationale for usage of mesh but I think the size and primary nature make this amenable to primary repair. We discussed risks including recurrence as well as postop restrictions. She is going to schedule for next month or so  Addendum: this area is palpable today and I think excising small ellipse of skin above umbilicus to remove old scar then repairing this open primarily would be best plan.  Will not do laparoscopy. She and I have both marked the area.

## 2014-09-05 NOTE — Op Note (Signed)
Preoperative diagnosis: Supraumbilical hernia Postoperative diagnosis: same as above Procedure: Primary umbilical hernia repair Surgeon: Dr. Serita Grammes Anesthesia: Gen. Estimated blood loss: Minimal Drains: None Specimens: None Complications: None Sponge needle count was correct at completion Disposition: To recovery room in stable condition  Indications: This is a healthy 43 year old female who has a small supraumbilical hernia bulge by her exam and by CT scan. We discussed proceeding with an open repair likely with suture. Risks and benefits were discussed.  Procedure: After informed consent was obtained the patient I both identified the area of the hernia. She was given cefazolin. Sequential compression devices were on her legs. She was then placed under general anesthesia without complication. Her abdomen was prepped and draped in the standard sterile surgical fashion. A surgical timeout was then performed.  I elected to excise in an old scar she had above her umbilicus after she and I discussed this. I made a crescent-shaped incision centered just above her umbilicus. This included her old scar. Once I had done this incision I removed that skin. I then was able to lift the umbilicus up and identify a 4 mm primary umbilical hernia. I elected to close with sutures. I used several #1 Novafil sutures to close down this defect. There was no tension. This closed very easily. Hemostasis was obtained. I then sutured her umbilicus back down with 3-0 undyed Vicryl. I closed the dermis and subcutaneous tissue with 3-0 Vicryl. The skin was closed with 5-0 Monocryl. Marcaine was infiltrated throughout the area. I then placed Dermabond and Steri-Strips. A dressing was placed over this. She tolerated this well was extubated and transferred to the recovery room in stable condition.

## 2014-09-05 NOTE — Anesthesia Postprocedure Evaluation (Signed)
  Anesthesia Post-op Note  Patient: Sara [name]  Procedure(s) Performed: Procedure(s):  OPEN HERNIA REPAIR VENTRAL ADULT (N/A)  Patient Location: PACU  Anesthesia Type:General   Level of Consciousness: awake and alert  Airway and Oxygen Therapy: Patient Spontanous Breathing and Patient connected to nasal cannula oxygen  Post-op Pain: none  Post-op Assessment: Post-op Vital signs reviewed, Patient's Cardiovascular Status Stable, Respiratory Function Stable, Patent Airway, No signs of Nausea or vomiting and Pain level controlled  Post-op Vital Signs: stable  Last Vitals:  Filed Vitals:   09/05/14 1504  BP: 118/49  Pulse: 91  Temp:   Resp: 15    Complications: No apparent anesthesia complications

## 2014-09-05 NOTE — Interval H&P Note (Signed)
History and Physical Interval Note:  09/05/2014 11:29 AM  Sara [name]  has presented today for surgery, with the diagnosis of ventral hernia  The various methods of treatment have been discussed with the patient and family. After consideration of risks, benefits and other options for treatment, the patient has consented to  Procedure(s): LAPAROSCOPIC VENTRAL HERNIA, DIAGNOSTIC LAPAROSCOPY (N/A) as a surgical intervention .  The patient's history has been reviewed, patient examined, no change in status, stable for surgery.  I have reviewed the patient's chart and labs.  Questions were answered to the patient's satisfaction.     Seth Friedlander

## 2014-09-05 NOTE — Transfer of Care (Signed)
Immediate Anesthesia Transfer of Care Note  Patient: Sara [name]  Procedure(s) Performed: Procedure(s):  OPEN HERNIA REPAIR VENTRAL ADULT (N/A)  Patient Location: PACU  Anesthesia Type:General  Level of Consciousness: awake, alert , oriented, patient cooperative and responds to stimulation  Airway & Oxygen Therapy: Patient Spontanous Breathing and Patient connected to nasal cannula oxygen  Post-op Assessment: Report given to PACU RN and Post -op Vital signs reviewed and stable  Post vital signs: Reviewed and stable  Complications: No apparent anesthesia complications

## 2014-09-05 NOTE — Anesthesia Preprocedure Evaluation (Addendum)
Anesthesia Evaluation  Patient identified by MRN, date of birth, ID band Patient awake    Reviewed: Allergy & Precautions, H&P , NPO status , Patient's Chart, lab work & pertinent test results  History of Anesthesia Complications (+) PONV and history of anesthetic complications  Airway Mallampati: I  TM Distance: >3 FB Neck ROM: Full    Dental  (+) Teeth Intact, Dental Advisory Given   Pulmonary neg pulmonary ROS,  breath sounds clear to auscultation        Cardiovascular negative cardio ROS  Rhythm:Regular Rate:Normal     Neuro/Psych negative neurological ROS     GI/Hepatic negative GI ROS, Neg liver ROS,   Endo/Other  negative endocrine ROS  Renal/GU negative Renal ROS     Musculoskeletal   Abdominal   Peds  Hematology negative hematology ROS (+)   Anesthesia Other Findings   Reproductive/Obstetrics 08/28/14 Preg test: NEG LMP 3 weeks ago                            Anesthesia Physical Anesthesia Plan  ASA: I  Anesthesia Plan: General   Post-op Pain Management:    Induction: Intravenous  Airway Management Planned: Oral ETT  Additional Equipment:   Intra-op Plan:   Post-operative Plan: Extubation in OR  Informed Consent: I have reviewed the patients History and Physical, chart, labs and discussed the procedure including the risks, benefits and alternatives for the proposed anesthesia with the patient or authorized representative who has indicated his/her understanding and acceptance.   Dental advisory given  Plan Discussed with: CRNA and Surgeon  Anesthesia Plan Comments: (Plan routine monitors, GETA)        Anesthesia Quick Evaluation

## 2014-09-05 NOTE — Discharge Instructions (Signed)
[facility]  POST OP INSTRUCTIONS  Always review your discharge instruction sheet given to you by the facility where your surgery was performed. IF YOU HAVE DISABILITY OR FAMILY LEAVE FORMS, YOU MUST BRING THEM TO THE OFFICE FOR PROCESSING.   DO NOT GIVE THEM TO YOUR DOCTOR.  1. A prescription for pain medication may be given to you upon discharge.  Take your pain medication as prescribed, if needed.  If narcotic pain medicine is not needed, then you may take acetaminophen (Tylenol), naprosyn (Alleve), or ibuprofen (Advil) as needed. 2. Take your usually prescribed medications unless otherwise directed. 3. If you need a refill on your pain medication, please contact your pharmacy.  They will contact our office to request authorization. Prescriptions will not be filled after 5pm or on week-ends. 4. You should follow a light diet the first day after arrival home, such as soup and crackers, etc.  Be sure to include lots of fluids daily. 5. Most patients will experience some swelling and bruising in the area of the incision.  Ice packs will help.  Swelling and bruising can take several days to resolve.  6. It is common to experience some constipation if taking pain medication after surgery.  Increasing fluid intake and taking a stool softener (such as Colace) will usually help or prevent this problem from occurring.  A mild laxative (Milk of Magnesia or Miralax) should be taken according to package instructions if there are no bowel movements after 48 hours. 7.  If your surgeon used skin glue on the incision, you may shower in 24 hours.  The glue will flake off over the next 2-3 weeks. Remove the dressing in 24 hours then may leave this off. 8. ACTIVITIES:  You may resume regular (light) daily activities beginning the next day--such as daily self-care, walking, climbing stairs--gradually increasing activities as tolerated.  You may have sexual  intercourse when it is comfortable.  Refrain from any heavy lifting or straining until approved by your doctor. a. You may drive when you are no longer taking prescription pain medication, you can comfortably wear a seatbelt, and you can safely maneuver your car and apply brakes. b. RETURN TO WORK:  __________________________________________________________ 9. You should see your doctor in the office for a follow-up appointment approximately 2-3 weeks after your surgery.  Make sure that you call for this appointment within a day or two after you arrive home to insure a convenient appointment time. 10. OTHER INSTRUCTIONS: __________________________________________________________________________________________________________________________ __________________________________________________________________________________________________________________________ WHEN TO CALL YOUR DOCTOR: 1. Fever over 101.0 2. Inability to urinate 3. Continued bleeding from incision. 4. Increased pain, redness, or drainage from the incision. 5. Increasing abdominal pain  The clinic staff is available to answer your questions during regular business hours.  Please dont hesitate to call and ask to speak to one of the nurses for clinical concerns.  If you have a medical emergency, go to the nearest emergency room or call 911.  A surgeon from [facility] is always on call at the hospital. [address] [place], [place], [place]  [address] ? [address], [place], [place]   [address] [phone] ? [phone] ? FAX [phone] Web site: [email or web address]  General Anesthesia, Adult, Care After  Refer to this sheet in the next few weeks. These instructions provide you with information on caring for yourself after your procedure. Your health care provider may also give you more specific instructions. Your treatment has been planned according to current medical  practices, but problems  sometimes occur. Call your health care provider if you have any problems or questions after your procedure.  WHAT TO EXPECT AFTER THE PROCEDURE  After the procedure, it is typical to experience:  Sleepiness.  Nausea and vomiting. HOME CARE INSTRUCTIONS  For the first 24 hours after general anesthesia:  Have a responsible person with you.  Do not drive a car. If you are alone, do not take public transportation.  Do not drink alcohol.  Do not take medicine that has not been prescribed by your health care provider.  Do not sign important papers or make important decisions.  You may resume a normal diet and activities as directed by your health care provider.  Change bandages (dressings) as directed.  If you have questions or problems that seem related to general anesthesia, call the hospital and ask for the anesthetist or anesthesiologist on call. SEEK MEDICAL CARE IF:  You have nausea and vomiting that continue the day after anesthesia.  You develop a rash. SEEK IMMEDIATE MEDICAL CARE IF:  You have difficulty breathing.  You have chest pain.  You have any allergic problems. Document Released: 01/17/2001 Document Revised: 06/13/2013 Document Reviewed: 04/26/2013  [facility] Patient Information 2014 [place], [place].

## 2014-09-06 ENCOUNTER — Encounter (HOSPITAL_COMMUNITY): Payer: Self-pay | Admitting: General Surgery

## 2014-12-13 ENCOUNTER — Telehealth: Payer: Self-pay | Admitting: Genetic Counselor

## 2014-12-13 ENCOUNTER — Encounter: Payer: Self-pay | Admitting: Genetic Counselor

## 2014-12-13 DIAGNOSIS — Z1379 Encounter for other screening for genetic and chromosomal anomalies: Secondary | ICD-10-CM

## 2014-12-13 HISTORY — DX: Encounter for other screening for genetic and chromosomal anomalies: Z13.79

## 2014-12-13 NOTE — Telephone Encounter (Signed)
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  Therefore her test is considered to be a Negative test.  Therefore, if there is a hereditary cancer syndrome running in the family due to a mutation in any of the genes she had been tested for, she did not inherit it and therefore is not at increased risk for cancer based on a change in those genes.  We discussed that testing someone who has cancer would be more informative.  Unfortunatley, everyone who had cancer is deceased.

## 2017-05-20 ENCOUNTER — Other Ambulatory Visit: Payer: Self-pay | Admitting: Obstetrics and Gynecology

## 2017-05-20 DIAGNOSIS — R928 Other abnormal and inconclusive findings on diagnostic imaging of breast: Secondary | ICD-10-CM

## 2017-05-25 ENCOUNTER — Ambulatory Visit: Admission: RE | Admit: 2017-05-25 | Payer: Self-pay | Source: Ambulatory Visit

## 2017-05-25 ENCOUNTER — Ambulatory Visit
Admission: RE | Admit: 2017-05-25 | Discharge: 2017-05-25 | Disposition: A | Discharge status: Home / Self Care | Payer: BLUE CROSS/BLUE SHIELD | Source: Ambulatory Visit | Attending: Obstetrics and Gynecology | Admitting: Obstetrics and Gynecology

## 2017-05-25 DIAGNOSIS — R928 Other abnormal and inconclusive findings on diagnostic imaging of breast: Secondary | ICD-10-CM

## 2018-05-26 IMAGING — MG 2D DIGITAL DIAGNOSTIC UNILATERAL RIGHT MAMMOGRAM WITH CAD AND AD
6 series · 6 of 14 positions shown · non-contrast
Comparison: Previous exam(s).

CLINICAL DATA: Screening recall for an asymmetry noted along the
medial aspect of the right breast on the implant displacement view
only.

EXAM:
2D DIGITAL DIAGNOSTIC UNILATERAL RIGHT MAMMOGRAM WITH CAD AND
ADJUNCT TOMO

[R CC synth-2D]
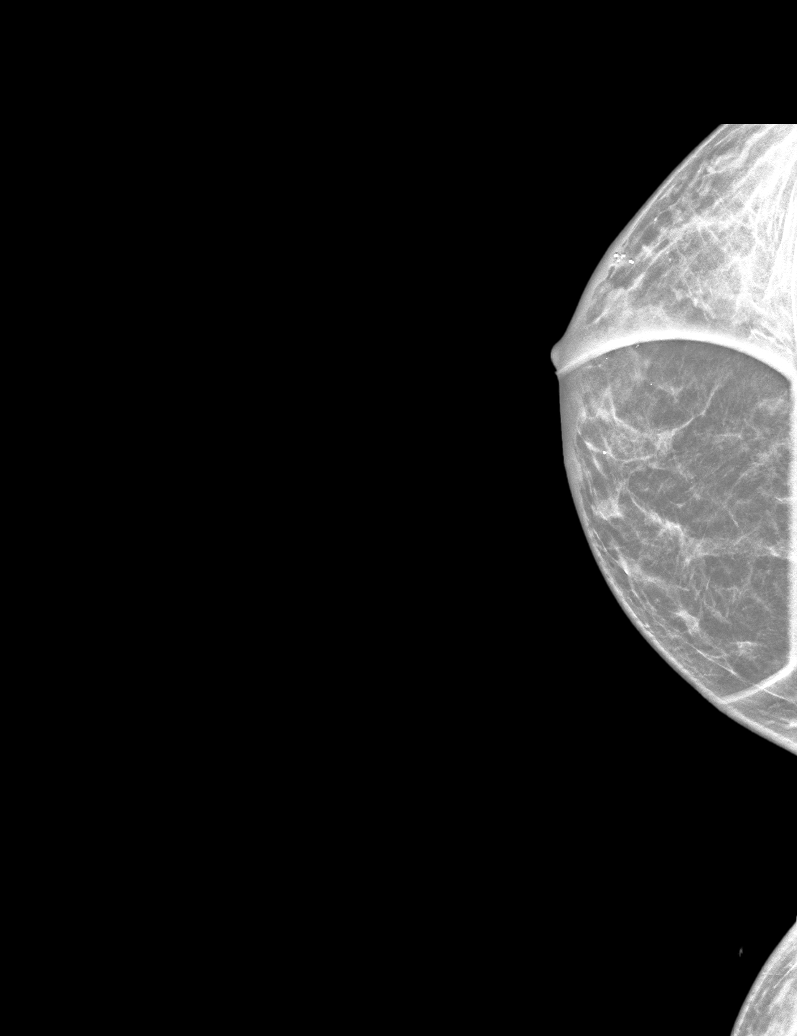

[R CC]
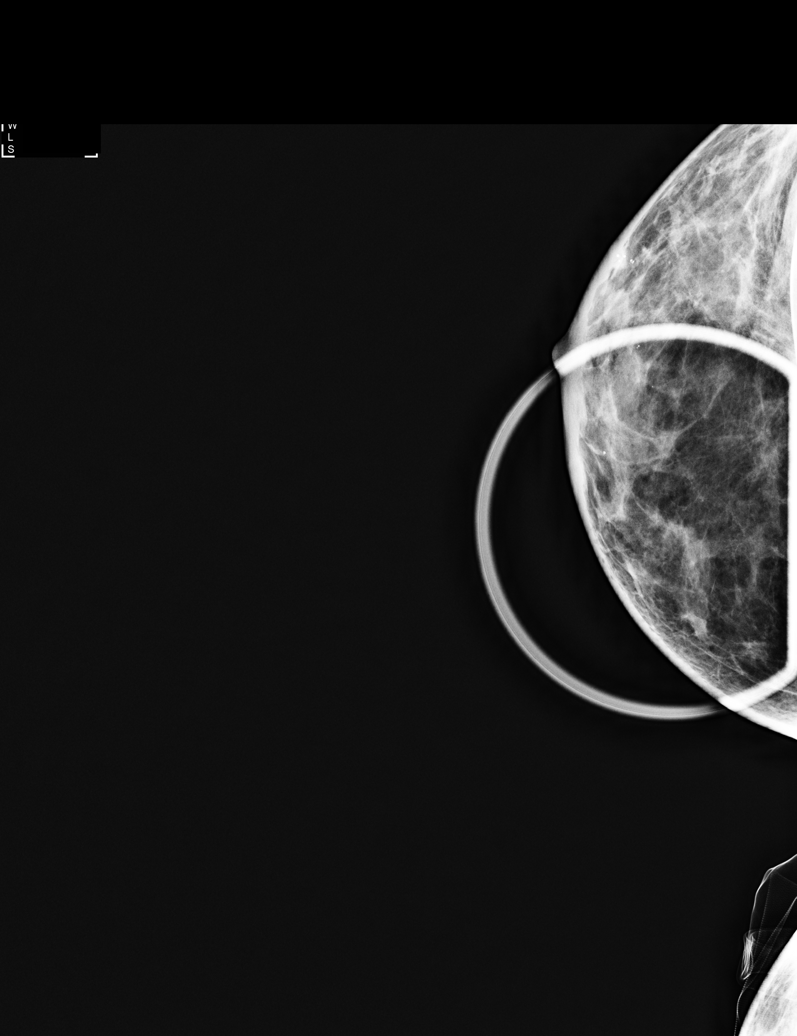

[R ML synth-2D]
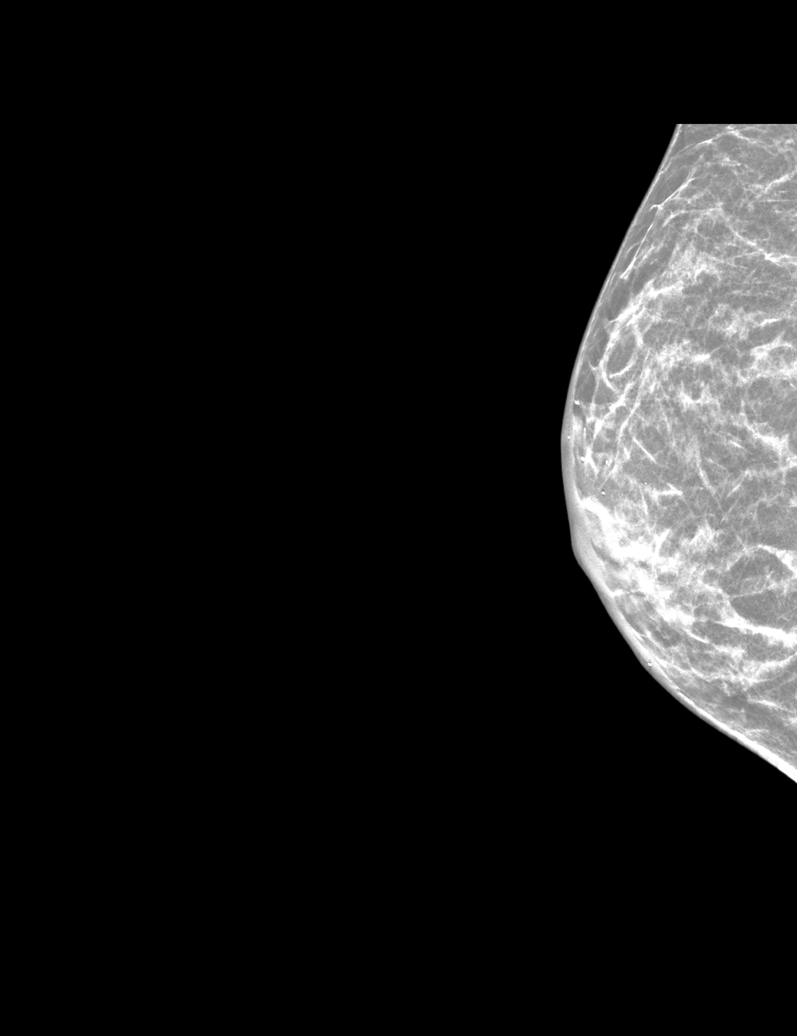

[R ML]
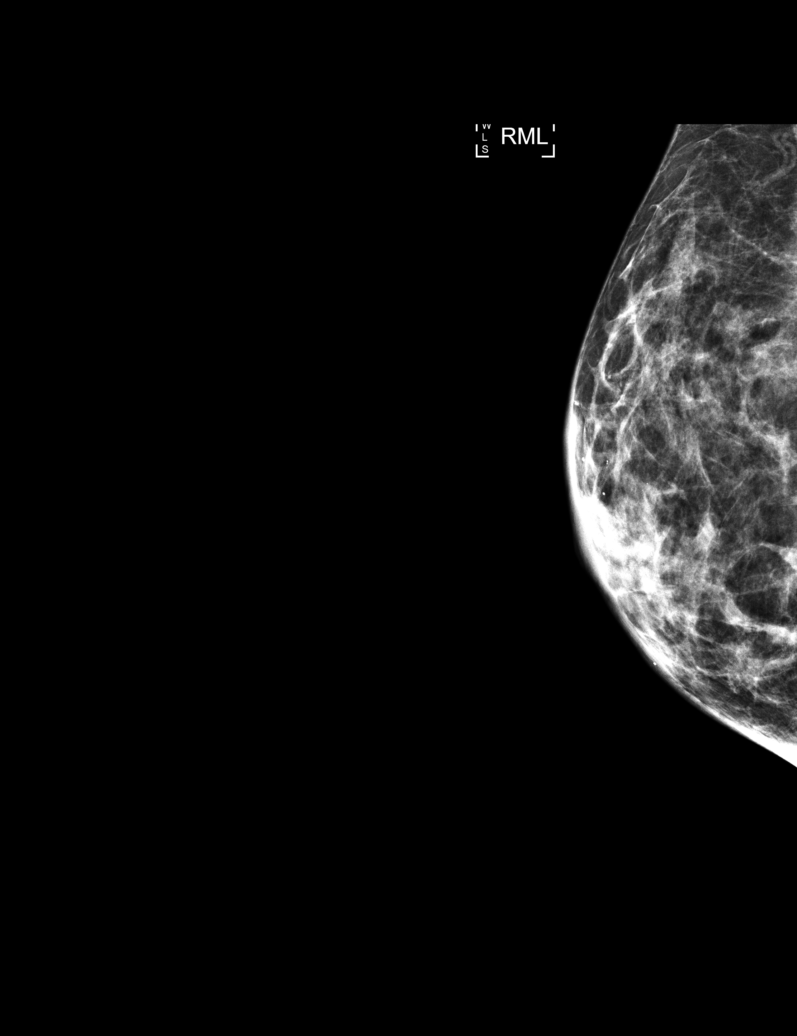

[R ML tomo · tomo slice 16/31.0]
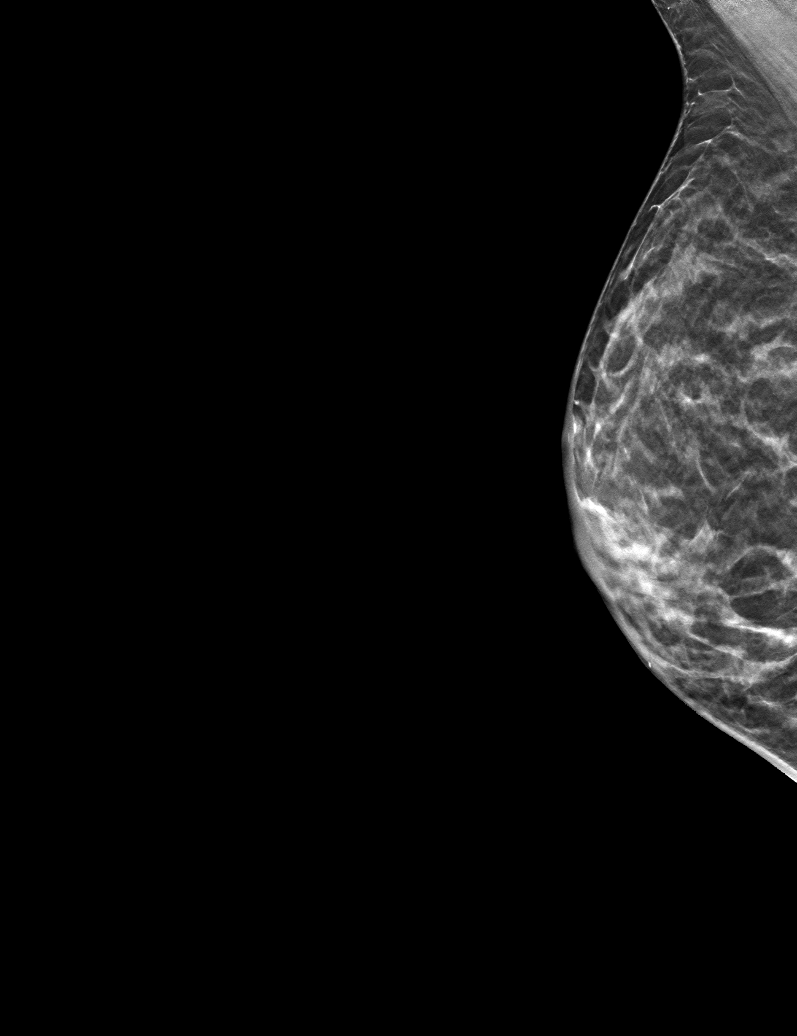

[R CC tomo · tomo slice 15/29.0]
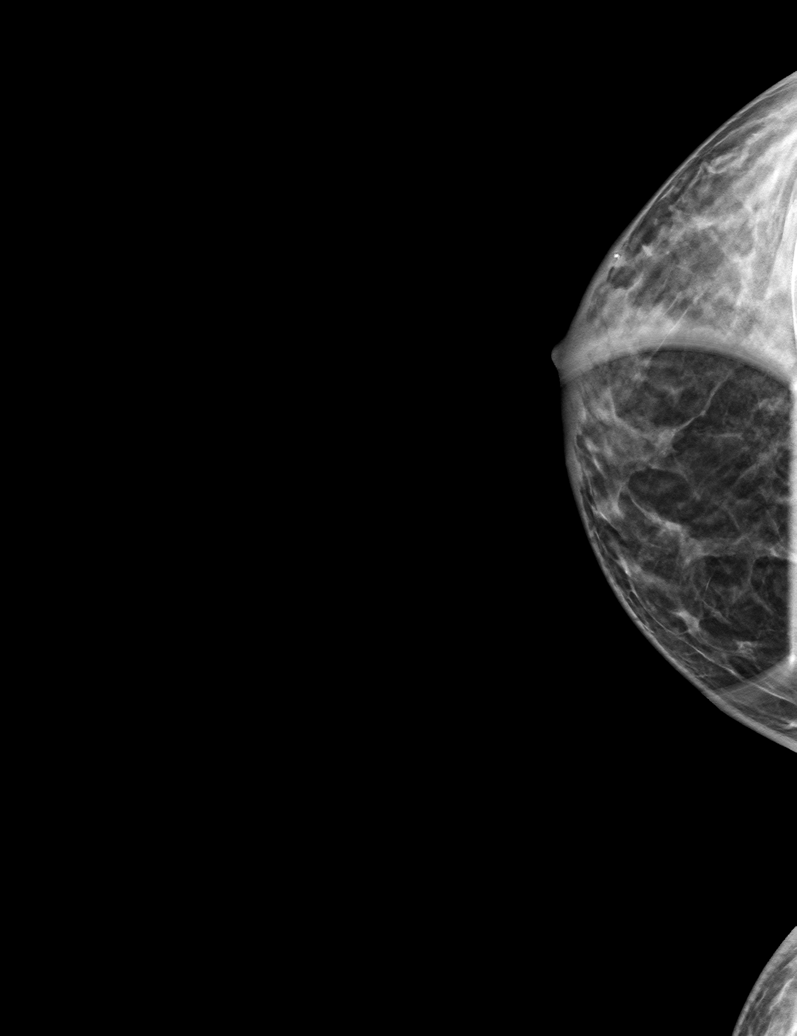

[6 of 14 positions shown; findings below may reference images not displayed]

ACR Breast Density Category c: The breast tissue is heterogeneously
dense, which may obscure small masses.
FINDINGS: The area of asymmetry noted along the medial right breast disperses
on the spot-compression 2D and 3D images. There is no underlying
mass, significant asymmetry or architectural distortion. There are
no suspicious calcifications

Mammographic images were processed with CAD.
IMPRESSION: 1. No evidence malignancy. Screening asymmetry was due to
superimposed fibroglandular tissue

RECOMMENDATION:
Screening mammogram in one year.(Code:P5-G-OAR)

I have discussed the findings and recommendations with the patient.
Results were also provided in writing at the conclusion of the
visit. If applicable, a reminder letter will be sent to the patient
regarding the next appointment.

BI-RADS CATEGORY  1: Negative.

## 2018-12-07 DIAGNOSIS — K432 Incisional hernia without obstruction or gangrene: Secondary | ICD-10-CM | POA: Diagnosis not present

## 2019-08-03 DIAGNOSIS — Z01419 Encounter for gynecological examination (general) (routine) without abnormal findings: Secondary | ICD-10-CM | POA: Diagnosis not present

## 2019-08-03 DIAGNOSIS — Z1231 Encounter for screening mammogram for malignant neoplasm of breast: Secondary | ICD-10-CM | POA: Diagnosis not present

## 2019-08-03 DIAGNOSIS — Z6821 Body mass index (BMI) 21.0-21.9, adult: Secondary | ICD-10-CM | POA: Diagnosis not present

## 2019-08-30 DIAGNOSIS — Z13220 Encounter for screening for lipoid disorders: Secondary | ICD-10-CM | POA: Diagnosis not present

## 2019-08-30 DIAGNOSIS — Z13228 Encounter for screening for other metabolic disorders: Secondary | ICD-10-CM | POA: Diagnosis not present

## 2019-08-30 DIAGNOSIS — Z130 Encounter for screening for diseases of the blood and blood-forming organs and certain disorders involving the immune mechanism: Secondary | ICD-10-CM | POA: Diagnosis not present

## 2019-08-30 DIAGNOSIS — Z1329 Encounter for screening for other suspected endocrine disorder: Secondary | ICD-10-CM | POA: Diagnosis not present

## 2019-08-30 DIAGNOSIS — Z131 Encounter for screening for diabetes mellitus: Secondary | ICD-10-CM | POA: Diagnosis not present

## 2019-11-05 ENCOUNTER — Other Ambulatory Visit: Payer: BLUE CROSS/BLUE SHIELD

## 2019-11-07 ENCOUNTER — Other Ambulatory Visit: Payer: BC Managed Care – PPO

## 2019-11-07 DIAGNOSIS — Z20828 Contact with and (suspected) exposure to other viral communicable diseases: Secondary | ICD-10-CM | POA: Diagnosis not present

## 2020-01-07 DIAGNOSIS — D225 Melanocytic nevi of trunk: Secondary | ICD-10-CM | POA: Diagnosis not present

## 2020-01-07 DIAGNOSIS — D485 Neoplasm of uncertain behavior of skin: Secondary | ICD-10-CM | POA: Diagnosis not present

## 2020-01-07 DIAGNOSIS — D2261 Melanocytic nevi of right upper limb, including shoulder: Secondary | ICD-10-CM | POA: Diagnosis not present

## 2020-01-07 DIAGNOSIS — L821 Other seborrheic keratosis: Secondary | ICD-10-CM | POA: Diagnosis not present

## 2020-01-07 DIAGNOSIS — D2239 Melanocytic nevi of other parts of face: Secondary | ICD-10-CM | POA: Diagnosis not present

## 2020-01-07 DIAGNOSIS — C44519 Basal cell carcinoma of skin of other part of trunk: Secondary | ICD-10-CM | POA: Diagnosis not present

## 2020-01-28 DIAGNOSIS — C44519 Basal cell carcinoma of skin of other part of trunk: Secondary | ICD-10-CM | POA: Diagnosis not present

## 2020-05-07 DIAGNOSIS — D2330 Other benign neoplasm of skin of unspecified part of face: Secondary | ICD-10-CM | POA: Diagnosis not present

## 2020-05-07 DIAGNOSIS — Z85828 Personal history of other malignant neoplasm of skin: Secondary | ICD-10-CM | POA: Diagnosis not present

## 2020-07-28 ENCOUNTER — Ambulatory Visit (INDEPENDENT_AMBULATORY_CARE_PROVIDER_SITE_OTHER): Payer: BC Managed Care – PPO | Admitting: Family Medicine

## 2020-07-28 ENCOUNTER — Encounter: Payer: Self-pay | Admitting: Family Medicine

## 2020-07-28 ENCOUNTER — Other Ambulatory Visit: Payer: Self-pay

## 2020-07-28 VITALS — BP 120/60 | HR 44 | Ht 66.25 in | Wt 128.8 lb

## 2020-07-28 DIAGNOSIS — Z136 Encounter for screening for cardiovascular disorders: Secondary | ICD-10-CM | POA: Diagnosis not present

## 2020-07-28 DIAGNOSIS — Z1159 Encounter for screening for other viral diseases: Secondary | ICD-10-CM

## 2020-07-28 DIAGNOSIS — Z13220 Encounter for screening for lipoid disorders: Secondary | ICD-10-CM

## 2020-07-28 DIAGNOSIS — Z23 Encounter for immunization: Secondary | ICD-10-CM

## 2020-07-28 DIAGNOSIS — Z1211 Encounter for screening for malignant neoplasm of colon: Secondary | ICD-10-CM

## 2020-07-28 DIAGNOSIS — Z0000 Encounter for general adult medical examination without abnormal findings: Secondary | ICD-10-CM

## 2020-07-28 DIAGNOSIS — Z8249 Family history of ischemic heart disease and other diseases of the circulatory system: Secondary | ICD-10-CM

## 2020-07-28 NOTE — Progress Notes (Signed)
Subjective:    Patient ID: Sara [name], female    DOB: 19-Dec-1970, 49 y.o.   MRN: [id number]  HPI Chief Complaint  Patient presents with  . new pt    new pt cpe  sees obgyn,    She is new to the practice and here for a complete physical exam.  States she had a salad approximately 2 hours ago. Previous medical care: no PCP  She typically sees her OB/GYN  Other providers: OB/GYN- Dr. Tressia Danas  Plastic surgery -Dr. Stephanie Coup  Dermatology- Amy Martinique    History of heart murmur- last echo in college and normal.   States she recently increased her cardio and her heart rate has been in the 40s resting.  Prior heart rate was in the 50s.  Asymptomatic and specifically denies dizziness, lightheadedness, chest pain, palpitations, shortness of breath. Family history of heart disease in her brother.  She played college volleyball at [employer] history: Lives with husband, 32 41 year old daughter, works as Building surveyor  She does not smoke or use drugs.  Alcohol is socially. Diet: clean diet  Excerise: rides Peloton, runs.  Regular exercise daily.  Immunizations: Last Tdap approximately 2007.  Covid vaccines up-to-date.  Flu shot requested today.  Health maintenance:  [facility] Colonoscopy: never  Last Gynecological Exam: 09/2019 Last Menstrual cycle: regular on OCPs Last Dental Exam: summer 2021 Last Eye Exam: last week   Wears seatbelt always, uses sunscreen, smoke detectors in home and functioning, does not text while driving and feels safe in home environment.   Reviewed allergies, medications, past medical, surgical, family, and social history.   Review of Systems Review of Systems Constitutional: -fever, -chills, -sweats, -unexpected weight change,-fatigue ENT: -runny nose, -ear pain, -sore throat Cardiology:  -chest pain, -palpitations, -edema Respiratory: -cough, -shortness of breath, -wheezing Gastroenterology: -abdominal  pain, -nausea, -vomiting, -diarrhea, -constipation  Hematology: -bleeding or bruising problems Musculoskeletal: -arthralgias, -myalgias, -joint swelling, -back pain Ophthalmology: -vision changes Urology: -dysuria, -difficulty urinating, -hematuria, -urinary frequency, -urgency Neurology: -headache, -weakness, -tingling, -numbness       Objective:   Physical Exam BP 120/60   Pulse (!) 44 Comment: normal for her  Ht 5' 6.25" (1.683 m)   Wt 128 lb 12.8 oz (58.4 kg)   BMI 20.63 kg/m   General Appearance:    Alert, cooperative, no distress, appears stated age  Head:    Normocephalic, without obvious abnormality, atraumatic  Eyes:    PERRL, conjunctiva/corneas clear, EOM's intact  Ears:    Normal TM's and external ear canals  Nose:   Mask on   Throat:   Mask on    Neck:   Supple, no lymphadenopathy;  thyroid:  no   enlargement/tenderness/nodules; no JVD  Back:    Spine nontender, no curvature, ROM normal  Lungs:     Clear to auscultation bilaterally without wheezes, rales or     ronchi; respirations unlabored  Chest Wall:    No tenderness or deformity   Heart:   Bradycardia and normal rhythm, S1 and S2 normal, no murmur, rub or gallop  Breast Exam:    OB//GYN   Abdomen:     Soft, non-tender, nondistended, normoactive bowel sounds,    no masses, no hepatosplenomegaly  Genitalia:    OB/GYN     Extremities:   No clubbing, cyanosis or edema  Pulses:   2+ and symmetric all extremities  Skin:   Skin color, texture, turgor normal, no rashes or  lesions  Lymph nodes:   Cervical, supraclavicular, and axillary nodes normal  Neurologic:   CNII-XII intact, normal strength, sensation and gait          Psych:   Normal mood, affect, hygiene and grooming.         Assessment & Plan:  Routine general medical examination at a health care facility - Plan: CBC with Differential/Platelet, Comprehensive metabolic panel, TSH, T4, free, Lipid panel -She is new to the practice and here today for a new  patient CPE. Reports being in her usual state of health.  She is in good spirits. Preventive health care reviewed and she sees her OB/GYN regularly for mammograms and Pap smears. She has never had a colonoscopy to screen for colon cancer.  I will refer her today. She appears to be taking good care of herself and has a healthy lifestyle. Immunizations reviewed and updated. Discussed safety and health promotion. Check baseline labs and follow-up  Needs flu shot - Plan: Flu Vaccine QUAD 36+ mos IM -Given and possible side effects discussed.  Need for hepatitis C screening test - Plan: Hepatitis C antibody -Done per screening guidelines  Need for Tdap vaccination - Plan: Tdap vaccine greater than or equal to 7yo IM -Counseling on all components of the vaccine  Family history of heart disease in brother - Plan: EKG 12-Lead -She reports her brother did not take good care of his health.  Screening for heart disease - Plan: EKG 12-Lead -Resting heart rate in the 40s and asymptomatic.  Discussed that this is fine and not unexpected.  Screening EKG done.  EKG shows sinus bradycardia with ventricular rate of 46.  PR interval 186 ms, QRS duration 74 ms, QT/QTc 500/437 ms. nonspecific changes and no acute ST changes.  Read by myself and Dr. Redmond School.  Screening for lipid disorders - Plan: Lipid panel -She is eating a healthy diet and exercising.  Follow-up pending lipid panel results  Screen for colon cancer - Plan: Ambulatory referral to Gastroenterology -Discussed new screening guidelines starting screening colonoscopies at age 93.  I will refer her to GI and they will call to schedule a visit.  She is agreeable

## 2020-07-28 NOTE — Patient Instructions (Signed)

## 2020-07-29 LAB — CBC WITH DIFFERENTIAL/PLATELET
Basophils Absolute: 0.1 10*3/uL (ref 0.0–0.2)
Basos: 2 %
EOS (ABSOLUTE): 0.2 10*3/uL (ref 0.0–0.4)
Eos: 2 %
Hematocrit: 38.4 % (ref 34.0–46.6)
Hemoglobin: 12.1 g/dL (ref 11.1–15.9)
Immature Grans (Abs): 0 10*3/uL (ref 0.0–0.1)
Immature Granulocytes: 0 %
Lymphocytes Absolute: 2.5 10*3/uL (ref 0.7–3.1)
Lymphs: 36 %
MCH: 24.2 pg — ABNORMAL LOW (ref 26.6–33.0)
MCHC: 31.5 g/dL (ref 31.5–35.7)
MCV: 77 fL — ABNORMAL LOW (ref 79–97)
Monocytes Absolute: 0.5 10*3/uL (ref 0.1–0.9)
Monocytes: 7 %
Neutrophils Absolute: 3.6 10*3/uL (ref 1.4–7.0)
Neutrophils: 53 %
Platelets: 226 10*3/uL (ref 150–450)
RBC: 5 x10E6/uL (ref 3.77–5.28)
RDW: 17.5 % — ABNORMAL HIGH (ref 11.7–15.4)
WBC: 6.9 10*3/uL (ref 3.4–10.8)

## 2020-07-29 LAB — COMPREHENSIVE METABOLIC PANEL
ALT: 21 IU/L (ref 0–32)
AST: 22 IU/L (ref 0–40)
Albumin/Globulin Ratio: 2.2 (ref 1.2–2.2)
Albumin: 4.6 g/dL (ref 3.8–4.8)
Alkaline Phosphatase: 78 IU/L (ref 44–121)
BUN/Creatinine Ratio: 16 (ref 9–23)
BUN: 14 mg/dL (ref 6–24)
Bilirubin Total: 0.6 mg/dL (ref 0.0–1.2)
CO2: 25 mmol/L (ref 20–29)
Calcium: 9.2 mg/dL (ref 8.7–10.2)
Chloride: 99 mmol/L (ref 96–106)
Creatinine, Ser: 0.89 mg/dL (ref 0.57–1.00)
GFR calc Af Amer: 89 mL/min/{1.73_m2} (ref >59)
GFR calc non Af Amer: 77 mL/min/{1.73_m2} (ref >59)
Globulin, Total: 2.1 g/dL (ref 1.5–4.5)
Glucose: 83 mg/dL (ref 65–99)
Potassium: 4.5 mmol/L (ref 3.5–5.2)
Sodium: 136 mmol/L (ref 134–144)
Total Protein: 6.7 g/dL (ref 6.0–8.5)

## 2020-07-29 LAB — LIPID PANEL
Chol/HDL Ratio: 2.6 ratio (ref 0.0–4.4)
Cholesterol, Total: 191 mg/dL (ref 100–199)
HDL: 73 mg/dL (ref >39)
LDL Chol Calc (NIH): 106 mg/dL — ABNORMAL HIGH (ref 0–99)
Triglycerides: 67 mg/dL (ref 0–149)
VLDL Cholesterol Cal: 12 mg/dL (ref 5–40)

## 2020-07-29 LAB — TSH: TSH: 1.81 u[IU]/mL (ref 0.450–4.500)

## 2020-07-29 LAB — T4, FREE: Free T4: 0.95 ng/dL (ref 0.82–1.77)

## 2020-07-29 LAB — HEPATITIS C ANTIBODY: Hep C Virus Ab: <0.1 s/co ratio (ref 0.0–0.9)

## 2020-07-31 DIAGNOSIS — N390 Urinary tract infection, site not specified: Secondary | ICD-10-CM | POA: Diagnosis not present

## 2020-07-31 DIAGNOSIS — R399 Unspecified symptoms and signs involving the genitourinary system: Secondary | ICD-10-CM | POA: Diagnosis not present

## 2020-09-29 DIAGNOSIS — Z6821 Body mass index (BMI) 21.0-21.9, adult: Secondary | ICD-10-CM | POA: Diagnosis not present

## 2020-09-29 DIAGNOSIS — Z01419 Encounter for gynecological examination (general) (routine) without abnormal findings: Secondary | ICD-10-CM | POA: Diagnosis not present

## 2020-09-29 DIAGNOSIS — Z1231 Encounter for screening mammogram for malignant neoplasm of breast: Secondary | ICD-10-CM | POA: Diagnosis not present

## 2020-10-16 ENCOUNTER — Other Ambulatory Visit: Payer: Self-pay | Admitting: Obstetrics and Gynecology

## 2020-10-16 DIAGNOSIS — Z803 Family history of malignant neoplasm of breast: Secondary | ICD-10-CM

## 2020-10-30 ENCOUNTER — Other Ambulatory Visit: Payer: BC Managed Care – PPO

## 2020-11-11 DIAGNOSIS — L821 Other seborrheic keratosis: Secondary | ICD-10-CM | POA: Diagnosis not present

## 2021-01-21 DIAGNOSIS — L821 Other seborrheic keratosis: Secondary | ICD-10-CM | POA: Diagnosis not present

## 2021-01-21 DIAGNOSIS — D235 Other benign neoplasm of skin of trunk: Secondary | ICD-10-CM | POA: Diagnosis not present

## 2021-01-21 DIAGNOSIS — D2261 Melanocytic nevi of right upper limb, including shoulder: Secondary | ICD-10-CM | POA: Diagnosis not present

## 2021-01-21 DIAGNOSIS — D2239 Melanocytic nevi of other parts of face: Secondary | ICD-10-CM | POA: Diagnosis not present

## 2021-01-21 DIAGNOSIS — D2262 Melanocytic nevi of left upper limb, including shoulder: Secondary | ICD-10-CM | POA: Diagnosis not present

## 2021-01-21 DIAGNOSIS — D485 Neoplasm of uncertain behavior of skin: Secondary | ICD-10-CM | POA: Diagnosis not present

## 2021-01-27 ENCOUNTER — Encounter: Payer: Self-pay | Admitting: Family Medicine

## 2021-01-27 DIAGNOSIS — Z1211 Encounter for screening for malignant neoplasm of colon: Secondary | ICD-10-CM

## 2021-02-25 DIAGNOSIS — N390 Urinary tract infection, site not specified: Secondary | ICD-10-CM | POA: Diagnosis not present

## 2021-02-25 DIAGNOSIS — N8111 Cystocele, midline: Secondary | ICD-10-CM | POA: Diagnosis not present

## 2021-04-01 ENCOUNTER — Encounter: Payer: Self-pay | Admitting: Internal Medicine

## 2021-05-13 DIAGNOSIS — Z1211 Encounter for screening for malignant neoplasm of colon: Secondary | ICD-10-CM | POA: Diagnosis not present

## 2021-05-13 LAB — COLOGUARD: Cologuard: NEGATIVE

## 2021-05-23 ENCOUNTER — Other Ambulatory Visit: Payer: Self-pay | Admitting: Family Medicine

## 2021-05-23 MED ORDER — NIRMATRELVIR/RITONAVIR (PAXLOVID)TABLET: 3.0000 | ORAL_TABLET | Freq: Two times a day (BID) | ORAL | 0 refills | Status: AC

## 2021-05-23 NOTE — Progress Notes (Signed)
She states that 5 days ago she developed cough, congestion, fever and tested 2 days ago with a positive test.  Her symptoms have not improved.  We will place her on Paxlovid

## 2021-05-25 ENCOUNTER — Encounter: Payer: Self-pay | Admitting: Family Medicine

## 2021-05-25 ENCOUNTER — Telehealth: Payer: BC Managed Care – PPO | Admitting: Family Medicine

## 2021-05-25 VITALS — BP 134/82 | HR 68 | Temp 99.9°F | Ht 66.25 in | Wt 130.0 lb

## 2021-05-25 DIAGNOSIS — H109 Unspecified conjunctivitis: Secondary | ICD-10-CM

## 2021-05-25 DIAGNOSIS — U071 COVID-19: Secondary | ICD-10-CM | POA: Diagnosis not present

## 2021-05-25 NOTE — Progress Notes (Signed)
Start time: 11:07 End time: 11:26  Virtual Visit via Video Note  I connected with Sara [name] on 05/25/21 by a video enabled telemedicine application and verified that I am speaking with the correct person using two identifiers.  Location: Patient: home Provider: office   I discussed the limitations of evaluation and management by telemedicine and the availability of in person appointments. The patient expressed understanding and agreed to proceed.  History of Present Illness:  Chief Complaint  Patient presents with   Medication Reaction    VIRTUAL tested positive for covid on Friday and started Paxlovid Saturday. The medication is working as far as helping in resolving her symptoms. She is having some side effects though-dizziness, nausea, HA and hot feverish flashes that last for 1.5-2hrs after she takes the Paxlovid. So not sure if should continue and push through. Also L eye discharge that began Sunday am-called [facility] and said to wait and see if the antiviral would help. 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 She called Dr. Redmond School (on-call) on 7/30, and he prescribed Paxlovid.  She has taken 3 doses so far.  She had severe cough, which has improved.  AT first phelgm had been green/brown, and is much less now.  She still has low grade fever.  Congestion and cough has significantly improved. She is contacting us today due to having dizziness, headaches, hot flashes, wondering if from medications.  She is also concerned about her eyes.  Yesterday she was having heavy discharge out of the left eye.  Later in the day, the eye started swelling, so she did online call with insurance doctor, who prescribed ofloxacin drops. She felt like the puffiness (which was slight prior to the medication) got much worse after the drops. She states that her L face was swollen around her eye last night. Today she reports that the mucus from the L eye is  much better, and the swelling in the face/eye is much better.  She started having some from the R eye today.  No sinus pain, just some pressure Taking Mucinex Fast-Max cold and flu, which helps, makes her loopy.  PMH, PSH, SH reviewed Relatively healthy Vaccinated x 3 for COVID   Outpatient Encounter Medications as of 05/25/2021  Medication Sig   CRANBERRY PO Take 1 tablet by mouth daily.   D-MANNOSE PO Take by mouth.   Multiple Vitamin (MULTIVITAMIN) tablet Take 1 tablet by mouth daily.   Multiple Vitamins-Minerals (AIRBORNE PO) Take 1 tablet by mouth daily.   nirmatrelvir/ritonavir EUA (PAXLOVID) TABS Take 3 tablets by mouth 2 (two) times daily for 5 days. (Take nirmatrelvir 150 mg two tablets twice daily for 5 days and ritonavir 100 mg one tablet twice daily for 5 days) Patient GFR is 77   norgestrel-ethinyl estradiol (LO/OVRAL,CRYSELLE) 0.3-30 MG-MCG tablet Take 1 tablet by mouth daily.   [DISCONTINUED] APPLE CIDER VINEGAR PO Take by mouth.   No facility-administered encounter medications on file as of 05/25/2021.   Allergies  Allergen Reactions   Mushroom Extract Complex Nausea And Vomiting   Sulfa Antibiotics Hives   ROS: +fever, URI symptoms, eye discharge and swelling per HPI. No n/v/d. +headache, dizziness per HPI. No chest pain, shortness of breath. Respiratory symptoms are improving. See HPI    Observations/Objective:  BP 134/82   Pulse 68   Temp 99.9 F (37.7 C) (Temporal)   Ht 5' 6.25" (1.683 m)   Wt  130 lb (59 kg)   LMP 04/08/2021 (Approximate)   SpO2 98%   BMI 20.82 kg/m   Well-appearing, pleasant female, in no distress. Speaking easily. No coughing during visit. HEENT: Pink L upper eyelid, slightly swollen.  No erythema or swelling noted beyond the upper lid. There is mild conjunctiva injection on the L, no crusting noted. There is slight yellow crusting on R, no erythema, STS. EOMI Normal eye contact, speech, alert and oriented. Exam is limited due to  virtual nature of the visit.  Assessment and Plan:  COVID-19 virus infection - at low risk, doesn't need to be on Paxlovid. She reports SE. Discussed potential for rebound w/med, not at risk for severe infection, can stop  Conjunctivitis of both eyes, unspecified conjunctivitis type - started on L, with significant STS, now improved. Given improvement with ofloxacin, cont med, can use in R eye also.  Supportive measures for COVID reviewed, and sx for which she should seek f/u care.  Mucinex DM 12 hour Sudafed   Ok to stop paxlovid Okay to use ofloxacin in both eyes--stop if any reaction occurs.    Follow Up Instructions:    I discussed the assessment and treatment plan with the patient. The patient was provided an opportunity to ask questions and all were answered. The patient agreed with the plan and demonstrated an understanding of the instructions.   The patient was advised to call back or seek an in-person evaluation if the symptoms worsen or if the condition fails to improve as anticipated.  I spent 22 minutes dedicated to the care of this patient, including pre-visit review of records, face to face time, post-visit ordering of testing and documentation.    Vikki Ports, MD

## 2021-05-25 NOTE — Patient Instructions (Signed)
Ok to stop Paxlovid--you may be having side effects, and technically are not at high risk to need to be taking this medication.  Okay to use the ofloxacin drops in both eyes, since you are having drainage/crusting on the right now as well.  If you develop any reaction on the right side, then stop the medication. It is possible that the conjunctivitis is viral (and related to COVID), but given that you had significant redness and swelling of the left eye, and that it seems much better today, after using antibiotic drops, I think it is safest to continue to use them.  We discussed using Mucinex DM 12 hour tablets twice daily, and Sudafed if needed for your cough and congestion.  Follow up if you have recurrent high fevers, worsening cough, shortness of breath, persistent discolored phlegm, or other concerns.

## 2021-05-28 ENCOUNTER — Encounter: Payer: Self-pay | Admitting: Internal Medicine

## 2021-08-02 NOTE — Patient Instructions (Signed)
Preventive Care 40-48 Years Old, Female Preventive care refers to lifestyle choices and visits with your health care provider that can promote health and wellness. This includes: A yearly physical exam. This is also called an annual wellness visit. Regular dental and eye exams. Immunizations. Screening for certain conditions. Healthy lifestyle choices, such as: Eating a healthy diet. Getting regular exercise. Not using drugs or products that contain nicotine and tobacco. Limiting alcohol use. What can I expect for my preventive care visit? Physical exam Your health care provider will check your: Height and weight. These may be used to calculate your BMI (body mass index). BMI is a measurement that tells if you are at a healthy weight. Heart rate and blood pressure. Body temperature. Skin for abnormal spots. Counseling Your health care provider may ask you questions about your: Past medical problems. Family's medical history. Alcohol, tobacco, and drug use. Emotional well-being. Home life and relationship well-being. Sexual activity. Diet, exercise, and sleep habits. Work and work environment. Access to firearms. Method of birth control. Menstrual cycle. Pregnancy history. What immunizations do I need? Vaccines are usually given at various ages, according to a schedule. Your health care provider will recommend vaccines for you based on your age, medical history, and lifestyle or other factors, such as travel or where you work. What tests do I need? Blood tests Lipid and cholesterol levels. These may be checked every 5 years, or more often if you are over 50 years old. Hepatitis C test. Hepatitis B test. Screening Lung cancer screening. You may have this screening every year starting at age 55 if you have a 30-pack-year history of smoking and currently smoke or have quit within the past 15 years. Colorectal cancer screening. All adults should have this screening starting at  age 50 and continuing until age 75. Your health care provider may recommend screening at age 45 if you are at increased risk. You will have tests every 1-10 years, depending on your results and the type of screening test. Diabetes screening. This is done by checking your blood sugar (glucose) after you have not eaten for a while (fasting). You may have this done every 1-3 years. Mammogram. This may be done every 1-2 years. Talk with your health care provider about when you should start having regular mammograms. This may depend on whether you have a family history of breast cancer. BRCA-related cancer screening. This may be done if you have a family history of breast, ovarian, tubal, or peritoneal cancers. Pelvic exam and Pap test. This may be done every 3 years starting at age 21. Starting at age 30, this may be done every 5 years if you have a Pap test in combination with an HPV test. Other tests STD (sexually transmitted disease) testing, if you are at risk. Bone density scan. This is done to screen for osteoporosis. You may have this scan if you are at high risk for osteoporosis. Talk with your health care provider about your test results, treatment options, and if necessary, the need for more tests. Follow these instructions at home: Eating and drinking  Eat a diet that includes fresh fruits and vegetables, whole grains, lean protein, and low-fat dairy products. Take vitamin and mineral supplements as recommended by your health care provider. Do not drink alcohol if: Your health care provider tells you not to drink. You are pregnant, may be pregnant, or are planning to become pregnant. If you drink alcohol: Limit how much you have to 0-1 drink a day. Be   aware of how much alcohol is in your drink. In the [place] one drink equals one 12 oz bottle of beer (355 mL), one 5 oz glass of wine (148 mL), or one 1 oz glass of hard liquor (44 mL). Lifestyle Take daily care of your teeth and  gums. Brush your teeth every morning and night with fluoride toothpaste. Floss one time each day. Stay active. Exercise for at least 30 minutes 5 or more days each week. Do not use any products that contain nicotine or tobacco, such as cigarettes, e-cigarettes, and chewing tobacco. If you need help quitting, ask your health care provider. Do not use drugs. If you are sexually active, practice safe sex. Use a condom or other form of protection to prevent STIs (sexually transmitted infections). If you do not wish to become pregnant, use a form of birth control. If you plan to become pregnant, see your health care provider for a prepregnancy visit. If told by your health care provider, take low-dose aspirin daily starting at age 63. Find healthy ways to cope with stress, such as: Meditation, yoga, or listening to music. Journaling. Talking to a trusted person. Spending time with friends and family. Safety Always wear your seat belt while driving or riding in a vehicle. Do not drive: If you have been drinking alcohol. Do not ride with someone who has been drinking. When you are tired or distracted. While texting. Wear a helmet and other protective equipment during sports activities. If you have firearms in your house, make sure you follow all gun safety procedures. What's next? Visit your health care provider once a year for an annual wellness visit. Ask your health care provider how often you should have your eyes and teeth checked. Stay up to date on all vaccines. This information is not intended to replace advice given to you by your health care provider. Make sure you discuss any questions you have with your health care provider. Document Revised: 12/19/2020 Document Reviewed: 06/22/2018 Elsevier Patient Education  2022 [employer].

## 2021-08-02 NOTE — Progress Notes (Signed)
Subjective:    Patient ID: Sara [name], female    DOB: 1970-10-29, 50 y.o.   MRN: [id number]  HPI Chief Complaint  Patient presents with   Annual Exam   She is here for a complete physical exam.  Other providers: OB/GYN- Dr. Tressia Danas  Plastic surgery -Dr. Stephanie Coup  Dermatology- Amy Martinique  Urogynecology- [place] Pink     History of heart murmur- last echo in college and normal.  Pulse consistently in the 42s.  Asymptomatic and specifically denies dizziness, lightheadedness, chest pain, palpitations, shortness of breath. Family history of heart disease in her brother.   She played college volleyball at [employer] history: Lives with husband, 70 48 year old daughter, works as Building surveyor  She does not smoke or use drugs.  Alcohol is socially. Diet: healthy  Excerise: rides Peloton, runs.  Regular exercise daily.   Immunizations:  Health maintenance:   Mammogram: last ear Colonoscopy: 04/2021 and negative  Last Gynecological Exam:  December 2021 Last Menstrual cycle:   Last Dental Exam: every 6 months  Last Eye Exam: scheduled   Wears seatbelt always, uses sunscreen, smoke detectors in home and functioning, does not text while driving and feels safe in home environment.   Reviewed allergies, medications, past medical, surgical, family, and social history.    Review of Systems Review of Systems Constitutional: -fever, -chills, -sweats, -unexpected weight change,-fatigue ENT: -runny nose, -ear pain, -sore throat Cardiology:  -chest pain, -palpitations, -edema Respiratory: -cough, -shortness of breath, -wheezing Gastroenterology: -abdominal pain, -nausea, -vomiting, -diarrhea, -constipation  Hematology: -bleeding or bruising problems Musculoskeletal: -arthralgias, -myalgias, -joint swelling, -back pain Ophthalmology: -vision changes Urology: -dysuria, -difficulty urinating, -hematuria, -urinary frequency, -urgency Neurology: -headache, -weakness,  -tingling, -numbness       Objective:   Physical Exam BP 112/84 (BP Location: Right Arm, Patient Position: Sitting)   Pulse (!) 56   Ht 5' 6"  (1.676 m)   Wt 136 lb 12.8 oz (62.1 kg)   LMP 07/06/2021   BMI 22.08 kg/m   General Appearance:    Alert, cooperative, no distress, appears stated age  Head:    Normocephalic, without obvious abnormality, atraumatic  Eyes:    PERRL, conjunctiva/corneas clear, EOM's intact  Ears:    Normal TM's and external ear canals  Nose:   Mask on  Throat:   Mask on   Neck:   Supple, no lymphadenopathy;  thyroid:  no   enlargement/tenderness/nodules; no JVD  Back:    Spine nontender, no curvature, ROM normal, no CVA     tenderness  Lungs:     Clear to auscultation bilaterally without wheezes, rales or     ronchi; respirations unlabored  Chest Wall:    No tenderness or deformity   Heart:    Regular rate and rhythm, S1 and S2 normal, no murmur, rub   or gallop  Breast Exam:    OB/GYN     No axillary lymphadenopathy  Abdomen:     Soft, non-tender, nondistended, normoactive bowel sounds,    no masses, no hepatosplenomegaly  Genitalia:    OB/GYN     Extremities:   No clubbing, cyanosis or edema  Pulses:   2+ and symmetric all extremities  Skin:   Skin color, texture, turgor normal, no rashes or lesions  Lymph nodes:   Cervical, supraclavicular, and axillary nodes normal  Neurologic:   CNII-XII intact, normal strength, sensation and gait          Psych:   Normal mood, affect, hygiene  and grooming.        Assessment & Plan:  Routine general medical examination at a health care facility - Plan: CBC with Differential/Platelet, Comprehensive metabolic panel, TSH, T4, free  Screening for thyroid disorder - Plan: TSH, T4, free  Needs flu shot - Plan: Flu Vaccine QUAD 6+ mos PF IM (Fluarix Quad PF)  Pleasant 50 year old female who is here today for a physical, she is nonfasting.  She is in her usual state of health and no concerns.  She sees her OB/GYN.   Up-to-date on Cologuard.  Discussed that this is a 3-year deal and then she will need to have another one.  Continue regular dental and eye exams.  Discussed immunizations.  Discussed safety.

## 2021-08-03 ENCOUNTER — Encounter: Payer: Self-pay | Admitting: Family Medicine

## 2021-08-03 ENCOUNTER — Ambulatory Visit (INDEPENDENT_AMBULATORY_CARE_PROVIDER_SITE_OTHER): Payer: BC Managed Care – PPO | Admitting: Family Medicine

## 2021-08-03 ENCOUNTER — Other Ambulatory Visit: Payer: Self-pay

## 2021-08-03 VITALS — BP 112/84 | HR 56 | Ht 66.0 in | Wt 136.8 lb

## 2021-08-03 DIAGNOSIS — Z1329 Encounter for screening for other suspected endocrine disorder: Secondary | ICD-10-CM

## 2021-08-03 DIAGNOSIS — Z0000 Encounter for general adult medical examination without abnormal findings: Secondary | ICD-10-CM | POA: Diagnosis not present

## 2021-08-03 DIAGNOSIS — Z23 Encounter for immunization: Secondary | ICD-10-CM

## 2021-08-04 LAB — COMPREHENSIVE METABOLIC PANEL
ALT: 15 IU/L (ref 0–32)
AST: 22 IU/L (ref 0–40)
Albumin/Globulin Ratio: 1.8 (ref 1.2–2.2)
Albumin: 4.2 g/dL (ref 3.8–4.8)
Alkaline Phosphatase: 80 IU/L (ref 44–121)
BUN/Creatinine Ratio: 11 (ref 9–23)
BUN: 11 mg/dL (ref 6–24)
Bilirubin Total: 0.7 mg/dL (ref 0.0–1.2)
CO2: 22 mmol/L (ref 20–29)
Calcium: 8.8 mg/dL (ref 8.7–10.2)
Chloride: 101 mmol/L (ref 96–106)
Creatinine, Ser: 1 mg/dL (ref 0.57–1.00)
Globulin, Total: 2.3 g/dL (ref 1.5–4.5)
Glucose: 88 mg/dL (ref 70–99)
Potassium: 4.4 mmol/L (ref 3.5–5.2)
Sodium: 138 mmol/L (ref 134–144)
Total Protein: 6.5 g/dL (ref 6.0–8.5)
eGFR: 69 mL/min/{1.73_m2} (ref >59)

## 2021-08-04 LAB — CBC WITH DIFFERENTIAL/PLATELET
Basophils Absolute: 0.1 10*3/uL (ref 0.0–0.2)
Basos: 1 %
EOS (ABSOLUTE): 0.3 10*3/uL (ref 0.0–0.4)
Eos: 4 %
Hematocrit: 36.7 % (ref 34.0–46.6)
Hemoglobin: 11.1 g/dL (ref 11.1–15.9)
Immature Grans (Abs): 0 10*3/uL (ref 0.0–0.1)
Immature Granulocytes: 0 %
Lymphocytes Absolute: 1.8 10*3/uL (ref 0.7–3.1)
Lymphs: 26 %
MCH: 24 pg — ABNORMAL LOW (ref 26.6–33.0)
MCHC: 30.2 g/dL — ABNORMAL LOW (ref 31.5–35.7)
MCV: 79 fL (ref 79–97)
Monocytes Absolute: 0.6 10*3/uL (ref 0.1–0.9)
Monocytes: 8 %
Neutrophils Absolute: 4 10*3/uL (ref 1.4–7.0)
Neutrophils: 61 %
Platelets: 241 10*3/uL (ref 150–450)
RBC: 4.62 x10E6/uL (ref 3.77–5.28)
RDW: 17.4 % — ABNORMAL HIGH (ref 11.7–15.4)
WBC: 6.7 10*3/uL (ref 3.4–10.8)

## 2021-08-04 LAB — TSH: TSH: 2.54 u[IU]/mL (ref 0.450–4.500)

## 2021-08-04 LAB — T4, FREE: Free T4: 1.02 ng/dL (ref 0.82–1.77)

## 2021-10-02 DIAGNOSIS — Z1231 Encounter for screening mammogram for malignant neoplasm of breast: Secondary | ICD-10-CM | POA: Diagnosis not present

## 2021-10-02 DIAGNOSIS — Z01419 Encounter for gynecological examination (general) (routine) without abnormal findings: Secondary | ICD-10-CM | POA: Diagnosis not present

## 2021-10-02 DIAGNOSIS — Z6821 Body mass index (BMI) 21.0-21.9, adult: Secondary | ICD-10-CM | POA: Diagnosis not present

## 2021-10-02 LAB — HM MAMMOGRAPHY

## 2021-10-09 LAB — HM PAP SMEAR

## 2022-02-23 DIAGNOSIS — D2261 Melanocytic nevi of right upper limb, including shoulder: Secondary | ICD-10-CM | POA: Diagnosis not present

## 2022-02-23 DIAGNOSIS — D2262 Melanocytic nevi of left upper limb, including shoulder: Secondary | ICD-10-CM | POA: Diagnosis not present

## 2022-02-23 DIAGNOSIS — D2239 Melanocytic nevi of other parts of face: Secondary | ICD-10-CM | POA: Diagnosis not present

## 2022-02-23 DIAGNOSIS — D224 Melanocytic nevi of scalp and neck: Secondary | ICD-10-CM | POA: Diagnosis not present

## 2022-05-26 DIAGNOSIS — D225 Melanocytic nevi of trunk: Secondary | ICD-10-CM | POA: Diagnosis not present

## 2022-05-26 DIAGNOSIS — L820 Inflamed seborrheic keratosis: Secondary | ICD-10-CM | POA: Diagnosis not present

## 2022-05-26 DIAGNOSIS — L821 Other seborrheic keratosis: Secondary | ICD-10-CM | POA: Diagnosis not present

## 2022-06-01 ENCOUNTER — Ambulatory Visit: Payer: BC Managed Care – PPO | Admitting: Physician Assistant

## 2022-08-03 ENCOUNTER — Ambulatory Visit: Payer: BC Managed Care – PPO | Admitting: Physician Assistant

## 2022-08-26 ENCOUNTER — Ambulatory Visit: Payer: BC Managed Care – PPO | Admitting: Physician Assistant

## 2022-09-08 ENCOUNTER — Encounter: Payer: Self-pay | Admitting: Internal Medicine

## 2022-09-14 ENCOUNTER — Ambulatory Visit: Payer: BC Managed Care – PPO | Admitting: Physician Assistant

## 2022-09-24 DIAGNOSIS — D2239 Melanocytic nevi of other parts of face: Secondary | ICD-10-CM | POA: Diagnosis not present

## 2022-09-24 DIAGNOSIS — C44311 Basal cell carcinoma of skin of nose: Secondary | ICD-10-CM | POA: Diagnosis not present

## 2022-09-24 DIAGNOSIS — L738 Other specified follicular disorders: Secondary | ICD-10-CM | POA: Diagnosis not present

## 2022-09-29 ENCOUNTER — Encounter: Payer: Self-pay | Admitting: Physician Assistant

## 2022-09-29 ENCOUNTER — Ambulatory Visit (INDEPENDENT_AMBULATORY_CARE_PROVIDER_SITE_OTHER): Payer: BC Managed Care – PPO | Admitting: Physician Assistant

## 2022-09-29 VITALS — BP 120/80 | HR 54 | Temp 97.3°F | Ht 65.0 in | Wt 137.4 lb

## 2022-09-29 DIAGNOSIS — Z8249 Family history of ischemic heart disease and other diseases of the circulatory system: Secondary | ICD-10-CM | POA: Diagnosis not present

## 2022-09-29 DIAGNOSIS — F419 Anxiety disorder, unspecified: Secondary | ICD-10-CM

## 2022-09-29 DIAGNOSIS — Z0000 Encounter for general adult medical examination without abnormal findings: Secondary | ICD-10-CM | POA: Diagnosis not present

## 2022-09-29 DIAGNOSIS — R229 Localized swelling, mass and lump, unspecified: Secondary | ICD-10-CM

## 2022-09-29 DIAGNOSIS — Z1211 Encounter for screening for malignant neoplasm of colon: Secondary | ICD-10-CM

## 2022-09-29 LAB — CBC WITH DIFFERENTIAL/PLATELET
Basophils Absolute: 0.1 10*3/uL (ref 0.0–0.1)
Basophils Relative: 1.4 % (ref 0.0–3.0)
Eosinophils Absolute: 0.1 10*3/uL (ref 0.0–0.7)
Eosinophils Relative: 1.5 % (ref 0.0–5.0)
HCT: 38.6 % (ref 36.0–46.0)
Hemoglobin: 12.3 g/dL (ref 12.0–15.0)
Lymphocytes Relative: 35.6 % (ref 12.0–46.0)
Lymphs Abs: 2.1 10*3/uL (ref 0.7–4.0)
MCHC: 31.9 g/dL (ref 30.0–36.0)
MCV: 74.7 fl — ABNORMAL LOW (ref 78.0–100.0)
Monocytes Absolute: 0.5 10*3/uL (ref 0.1–1.0)
Monocytes Relative: 9 % (ref 3.0–12.0)
Neutro Abs: 3.1 10*3/uL (ref 1.4–7.7)
Neutrophils Relative %: 52.5 % (ref 43.0–77.0)
Platelets: 214 10*3/uL (ref 150.0–400.0)
RBC: 5.16 Mil/uL — ABNORMAL HIGH (ref 3.87–5.11)
RDW: 19.2 % — ABNORMAL HIGH (ref 11.5–15.5)
WBC: 5.8 10*3/uL (ref 4.0–10.5)

## 2022-09-29 LAB — COMPREHENSIVE METABOLIC PANEL
ALT: 17 U/L (ref 0–35)
AST: 18 U/L (ref 0–37)
Albumin: 4.4 g/dL (ref 3.5–5.2)
Alkaline Phosphatase: 60 U/L (ref 39–117)
BUN: 20 mg/dL (ref 6–23)
CO2: 27 mEq/L (ref 19–32)
Calcium: 9.2 mg/dL (ref 8.4–10.5)
Chloride: 104 mEq/L (ref 96–112)
Creatinine, Ser: 0.87 mg/dL (ref 0.40–1.20)
GFR: 77.97 mL/min (ref >60.00)
Glucose, Bld: 95 mg/dL (ref 70–99)
Potassium: 4 mEq/L (ref 3.5–5.1)
Sodium: 139 mEq/L (ref 135–145)
Total Bilirubin: 0.6 mg/dL (ref 0.2–1.2)
Total Protein: 6.7 g/dL (ref 6.0–8.3)

## 2022-09-29 LAB — LIPID PANEL
Cholesterol: 164 mg/dL (ref 0–200)
HDL: 62.4 mg/dL (ref >39.00)
LDL Cholesterol: 81 mg/dL (ref 0–99)
NonHDL: 101.14
Total CHOL/HDL Ratio: 3
Triglycerides: 99 mg/dL (ref 0.0–149.0)
VLDL: 19.8 mg/dL (ref 0.0–40.0)

## 2022-09-29 NOTE — Progress Notes (Signed)
Subjective:    Sara [name] is a 51 y.o. female and is here for a comprehensive physical exam.  HPI  Health Maintenance Due  Topic Date Due   HIV Screening  Never done   MAMMOGRAM  05/15/2016   PAP SMEAR-Modifier  07/25/2022    Acute Concerns: Left wrist nodule Patient explains that over the last 30 days she has a cyst on her left wrist. She states that it has gotten bigger, but not significantly. She denies hitting it on anything. Non-tender.  Chronic Issues: Hx of anxiety Patient reports that she had anxiety from her brother passing from a heart attack and no longer suffers anxiety around this. She used to have a fear of flying, but states that it went away by itself. She also has public speaking anxiety when speaking around a crowd of 50+ people on a topic she is not comfortable with. Patient expresses that it is controlled with 0.25 mg Xanax. Denies SI/HI.  Health Maintenance: Cologuard- last completed 05/13/2021. Mammogram -- last completed on 05/25/2017. Diet -- She maintains a well balanced diet. Exercise -- Patient participates in regular exercise.  Sleep habits -- She reports that she does not sleep much due to work and helping her daughter with homework.  Mood -- She is in a stable mood this visit.  UTD with dentist? - She is UTD on dental care. UTD with eye doctor? - She is UTD on vision care.  Weight history: Wt Readings from Last 10 Encounters:  09/29/22 137 lb 6.1 oz (62.3 kg)  08/03/21 136 lb 12.8 oz (62.1 kg)  05/25/21 130 lb (59 kg)  07/28/20 128 lb 12.8 oz (58.4 kg)  09/05/14 128 lb 11.2 oz (58.4 kg)  08/28/14 128 lb 11.2 oz (58.4 kg)  04/23/14 125 lb (56.7 kg)   Body mass index is 22.86 kg/m. Patient's last menstrual period was 09/29/2022 (exact date).  Alcohol use:  reports current alcohol use of about 3.0 standard drinks of alcohol per week.  Tobacco use:  Tobacco Use: Low Risk  (09/29/2022)   Patient History    Smoking Tobacco Use: Never     Smokeless Tobacco Use: Never    Passive Exposure: Not on file   Eligible for lung cancer screening? no     09/29/2022    8:34 AM  Depression screen PHQ 2/9  Decreased Interest 0  Down, Depressed, Hopeless 0  PHQ - 2 Score 0     Other providers/specialists: Patient Care Team: Jarold Motto, [place] as PCP - General (Physician Assistant)    PMHx, SurgHx, SocialHx, Medications, and Allergies were reviewed in the Visit Navigator and updated as appropriate.   Past Medical History:  Diagnosis Date   Allergy    Anemia    Genetic testing 12/13/2014   Heart murmur    as a child   History of shingles 10/25/1993   PONV (postoperative nausea and vomiting)      Past Surgical History:  Procedure Laterality Date   bilateral bunionectomy     CESAREAN SECTION  10/25/2005   COSMETIC SURGERY  2007   Breasts   PLACEMENT OF BREAST IMPLANTS  10/25/2005   VENTRAL HERNIA REPAIR N/A 09/05/2014   Procedure:  OPEN HERNIA REPAIR VENTRAL ADULT;  Surgeon: Emelia Loron, MD;  Location: [facility] OR;  Service: General;  Laterality: N/A;   wisdom teeth extracted       Family History  Problem Relation Age of Onset   Breast cancer Mother 76   Leukemia Mother  from chemotherapy   Cancer Mother    Glaucoma Father    Heart disease Brother 57       history of drug and ETOH abuse   CAD Brother    Early death Brother    CAD Maternal Grandfather    Leukemia Paternal Grandmother    Colon cancer Paternal Grandfather        diagnosed in his 38s   Ovarian cancer Maternal Aunt 67    Social History   Tobacco Use   Smoking status: Never   Smokeless tobacco: Never  Vaping Use   Vaping Use: Never used  Substance Use Topics   Alcohol use: Yes    Alcohol/week: 3.0 standard drinks of alcohol    Types: 3 Glasses of wine per week    Comment: socially   Drug use: No    Review of Systems:   Review of Systems  Constitutional:  Negative for chills, fever, malaise/fatigue and weight loss.   HENT:  Negative for hearing loss, sinus pain and sore throat.   Respiratory:  Negative for cough and hemoptysis.   Cardiovascular:  Negative for chest pain, palpitations, leg swelling and PND.  Gastrointestinal:  Negative for abdominal pain, constipation, diarrhea, heartburn, nausea and vomiting.  Genitourinary:  Negative for dysuria, frequency and urgency.  Musculoskeletal:  Negative for back pain, myalgias and neck pain.  Skin:  Negative for itching and rash.  Endo/Heme/Allergies:  Negative for polydipsia.  Psychiatric/Behavioral:  Negative for depression. The patient is not nervous/anxious.     Objective:   BP 120/80 (BP Location: Left Arm, Patient Position: Sitting, Cuff Size: Normal)   Pulse (!) 54   Temp (!) 97.3 F (36.3 C) (Temporal)   Ht 5\' 5"  (1.651 m)   Wt 137 lb 6.1 oz (62.3 kg)   LMP 09/29/2022 (Exact Date)   SpO2 98%   BMI 22.86 kg/m  Body mass index is 22.86 kg/m.   General Appearance:    Alert, cooperative, no distress, appears stated age  Head:    Normocephalic, without obvious abnormality, atraumatic  Eyes:    PERRL, conjunctiva/corneas clear, EOM's intact, fundi    benign, both eyes  Ears:    Normal TM's and external ear canals, both ears  Nose:   Nares normal, septum midline, mucosa normal, no drainage    or sinus tenderness  Throat:   Lips, mucosa, and tongue normal; teeth and gums normal  Neck:   Supple, symmetrical, trachea midline, no adenopathy;    thyroid:  no enlargement/tenderness/nodules; no carotid   bruit or JVD  Back:     Symmetric, no curvature, ROM normal, no CVA tenderness  Lungs:     Clear to auscultation bilaterally, respirations unlabored  Chest Wall:    No tenderness or deformity   Heart:    Regular rate and rhythm, S1 and S2 normal, no murmur, rub or gallop  Breast Exam:    Deferred   Abdomen:     Soft, non-tender, bowel sounds active all four quadrants,    no masses, no organomegaly  Genitalia:    Deferred   Extremities:    Extremities normal, atraumatic, no cyanosis or edema Tiny wrist nodule to radial aspect of left wrist -- non tender  Pulses:   2+ and symmetric all extremities  Skin:   Skin color, texture, turgor normal, no rashes or lesions  Lymph nodes:   Cervical, supraclavicular, and axillary nodes normal  Neurologic:   CNII-XII intact, normal strength, sensation and reflexes  throughout    Assessment/Plan:   Routine physical examination Today patient counseled on age appropriate routine health concerns for screening and prevention, each reviewed and up to date or declined. Immunizations reviewed and up to date or declined. Labs ordered and reviewed. Risk factors for depression reviewed and negative. Hearing function and visual acuity are intact. ADLs screened and addressed as needed. Functional ability and level of safety reviewed and appropriate. Education, counseling and referrals performed based on assessed risks today. Patient provided with a copy of personalized plan for preventive services.  Screen for colon cancer Colonoscopy referral placed  Family history of heart disease in brother Agreeable to Calcium Score -- discussed  Skin nodule No red flags Suspect cyst -- recommend Sports Medicine referral -- she will let us know when she is ready for this  Anxiety Well controlled Follow-up as needed Denies SI/HI  I, Gilman Buttner, acting as a Neurosurgeon for [employer], PA.,have documented all relevant documentation on the behalf of Jarold Motto, PA,as directed by  Jarold Motto, PA while in the presence of Jarold Motto, [place].  I, Jarold Motto, [place], have reviewed all documentation for this visit. The documentation on 09/29/22 for the exam, diagnosis, procedures, and orders are all accurate and complete.   Jarold Motto, PA-C [facility]

## 2022-09-29 NOTE — Patient Instructions (Signed)
It was great to see you!  I will place referral to GI for colonoscopy I will place order for Calcium Score  **You will be contacted about both of these  Let me know if you want your wrist evaluated and I will place referral to sports medicine  Please go to the lab for blood work.   Our office will call you with your results unless you have chosen to receive results via MyChart.  If your blood work is normal we will follow-up each year for physicals and as scheduled for chronic medical problems.  If anything is abnormal we will treat accordingly and get you in for a follow-up.  Take care,  Lelon Mast

## 2022-10-06 ENCOUNTER — Encounter: Payer: Self-pay | Admitting: Physician Assistant

## 2022-10-19 ENCOUNTER — Encounter: Payer: Self-pay | Admitting: Physician Assistant

## 2022-10-19 DIAGNOSIS — Z01419 Encounter for gynecological examination (general) (routine) without abnormal findings: Secondary | ICD-10-CM | POA: Diagnosis not present

## 2022-10-19 DIAGNOSIS — Z6822 Body mass index (BMI) 22.0-22.9, adult: Secondary | ICD-10-CM | POA: Diagnosis not present

## 2022-10-19 DIAGNOSIS — Z1231 Encounter for screening mammogram for malignant neoplasm of breast: Secondary | ICD-10-CM | POA: Diagnosis not present

## 2022-10-19 LAB — HM MAMMOGRAPHY

## 2022-11-10 ENCOUNTER — Encounter: Payer: Self-pay | Admitting: Physician Assistant

## 2022-11-16 ENCOUNTER — Encounter: Payer: Self-pay | Admitting: Physician Assistant

## 2022-11-24 ENCOUNTER — Ambulatory Visit (HOSPITAL_BASED_OUTPATIENT_CLINIC_OR_DEPARTMENT_OTHER): Payer: BC Managed Care – PPO

## 2022-12-17 ENCOUNTER — Ambulatory Visit (HOSPITAL_BASED_OUTPATIENT_CLINIC_OR_DEPARTMENT_OTHER)
Admission: RE | Admit: 2022-12-17 | Discharge: 2022-12-17 | Disposition: A | Discharge status: Home / Self Care | Payer: BC Managed Care – PPO | Source: Ambulatory Visit | Attending: Physician Assistant | Admitting: Physician Assistant

## 2022-12-17 DIAGNOSIS — Z8249 Family history of ischemic heart disease and other diseases of the circulatory system: Secondary | ICD-10-CM | POA: Insufficient documentation

## 2022-12-22 DIAGNOSIS — C44311 Basal cell carcinoma of skin of nose: Secondary | ICD-10-CM | POA: Diagnosis not present

## 2022-12-22 DIAGNOSIS — Z85828 Personal history of other malignant neoplasm of skin: Secondary | ICD-10-CM | POA: Diagnosis not present

## 2023-02-22 ENCOUNTER — Encounter: Payer: Self-pay | Admitting: Internal Medicine

## 2023-04-04 DIAGNOSIS — R300 Dysuria: Secondary | ICD-10-CM | POA: Diagnosis not present

## 2023-04-15 ENCOUNTER — Encounter: Payer: BC Managed Care – PPO | Admitting: Internal Medicine

## 2023-05-16 DIAGNOSIS — D2272 Melanocytic nevi of left lower limb, including hip: Secondary | ICD-10-CM | POA: Diagnosis not present

## 2023-05-16 DIAGNOSIS — L821 Other seborrheic keratosis: Secondary | ICD-10-CM | POA: Diagnosis not present

## 2023-05-16 DIAGNOSIS — L738 Other specified follicular disorders: Secondary | ICD-10-CM | POA: Diagnosis not present

## 2023-05-16 DIAGNOSIS — Z85828 Personal history of other malignant neoplasm of skin: Secondary | ICD-10-CM | POA: Diagnosis not present

## 2023-05-16 DIAGNOSIS — D485 Neoplasm of uncertain behavior of skin: Secondary | ICD-10-CM | POA: Diagnosis not present

## 2023-05-16 DIAGNOSIS — D224 Melanocytic nevi of scalp and neck: Secondary | ICD-10-CM | POA: Diagnosis not present

## 2023-05-16 DIAGNOSIS — L218 Other seborrheic dermatitis: Secondary | ICD-10-CM | POA: Diagnosis not present

## 2023-06-06 ENCOUNTER — Ambulatory Visit (AMBULATORY_SURGERY_CENTER): Payer: BC Managed Care – PPO | Admitting: *Deleted

## 2023-06-06 VITALS — Ht 66.0 in | Wt 135.0 lb

## 2023-06-06 DIAGNOSIS — Z1211 Encounter for screening for malignant neoplasm of colon: Secondary | ICD-10-CM

## 2023-06-06 MED ORDER — NA SULFATE-K SULFATE-MG SULF 17.5-3.13-1.6 GM/177ML PO SOLN
1.0000 | Freq: Once | ORAL | 0 refills | Status: AC
Start: 2023-06-06 — End: 2023-06-06

## 2023-06-06 NOTE — Progress Notes (Signed)
Pt's name and DOB verified at the beginning of the pre-visit.  Pt denies any difficulty with ambulating,sitting, laying down or rolling side to side Gave both [facility] main # and MD on call # prior to instructions.  No egg or soy allergy known to patient  No issues known to pt with past sedation with any surgeries or procedures Pt denies having issues being intub Pt has no issues moving head neck or swallowing No FH of Malignant Hyperthermia Pt is not on diet pills Pt is not on home 02  Pt is not on blood thinners  Pt denies issues with constipation  Pt is not on dialysis Pt denise any abnormal heart rhythms but see below Pt denies any upcoming cardiac testing Pt encouraged to use to use Singlecare or Goodrx to reduce cost  Patient's chart reviewed by Cathlyn Parsons CNRA prior to pre-visit and patient appropriate for the [facility].  Pre-visit completed and red dot placed by patient's name on their procedure day (on provider's schedule).  . Visit by phone Pt states weight is 135 lb Instructed pt why it is important to and  to call if they have any changes in health or new medications. Directed them to the # given and on instructions.   Pt states they will.  Instructions reviewed with pt and pt states understanding. Instructed to review again prior to procedure. Pt states they will.  Instructions sent by mail with coupon and by my chart Pt states her HR and BP can be low due to being very active

## 2023-06-17 ENCOUNTER — Encounter: Payer: Self-pay | Admitting: Internal Medicine

## 2023-07-04 ENCOUNTER — Encounter: Payer: Self-pay | Admitting: Internal Medicine

## 2023-07-04 ENCOUNTER — Ambulatory Visit (AMBULATORY_SURGERY_CENTER): Payer: BC Managed Care – PPO | Admitting: Internal Medicine

## 2023-07-04 VITALS — BP 95/58 | HR 51 | Temp 97.7°F | Resp 13 | Ht 66.0 in | Wt 135.0 lb

## 2023-07-04 DIAGNOSIS — Z1211 Encounter for screening for malignant neoplasm of colon: Secondary | ICD-10-CM | POA: Diagnosis not present

## 2023-07-04 MED ORDER — HYDROCORTISONE (PERIANAL) 2.5 % EX CREA: 1.0000 | TOPICAL_CREAM | Freq: Two times a day (BID) | CUTANEOUS | 0 refills | Status: DC

## 2023-07-04 MED ORDER — SODIUM CHLORIDE 0.9 % IV SOLN: 500.0000 mL | Freq: Once | INTRAVENOUS | Status: DC

## 2023-07-04 NOTE — Op Note (Addendum)
[facility] Patient Name: Sara [name] Procedure Date: 07/04/2023 9:20 AM MRN: [id number] Endoscopist: Particia Lather , , [id number] Age: 52 Referring MD:  Date of Birth: 03-Aug-1971 Gender: Female Account #: [id number] Procedure:                Colonoscopy Indications:              Screening for colorectal malignant neoplasm, This                            is the patient's first colonoscopy Medicines:                Monitored Anesthesia Care Procedure:                Pre-Anesthesia Assessment:                           - Prior to the procedure, a History and Physical                            was performed, and patient medications and                            allergies were reviewed. The patient's tolerance of                            previous anesthesia was also reviewed. The risks                            and benefits of the procedure and the sedation                            options and risks were discussed with the patient.                            All questions were answered, and informed consent                            was obtained. Prior Anticoagulants: The patient has                            taken no anticoagulant or antiplatelet agents. ASA                            Grade Assessment: II - A patient with mild systemic                            disease. After reviewing the risks and benefits,                            the patient was deemed in satisfactory condition to                            undergo the procedure.  After obtaining informed consent, the colonoscope                            was passed under direct vision. Throughout the                            procedure, the patient's blood pressure, pulse, and                            oxygen saturations were monitored continuously. The                            PCF-HQ190L Colonoscope [id number] was introduced                            through the anus and  advanced to the the terminal                            ileum. The colonoscopy was performed without                            difficulty. The patient tolerated the procedure                            well. The quality of the bowel preparation was                            good. The terminal ileum, ileocecal valve,                            appendiceal orifice, and rectum were photographed. Scope In: 9:27:46 AM Scope Out: 9:40:45 AM Scope Withdrawal Time: 0 hours 11 minutes 11 seconds  Total Procedure Duration: 0 hours 12 minutes 59 seconds  Findings:                 The terminal ileum appeared normal.                           Non-bleeding internal hemorrhoids were found during                            retroflexion.                           The exam was otherwise without abnormality. Complications:            No immediate complications. Estimated Blood Loss:     Estimated blood loss: none. Impression:               - The examined portion of the ileum was normal.                           - Non-bleeding internal hemorrhoids.                           - The examination was otherwise normal.                           -  No specimens collected. Recommendation:           - Discharge patient to home (with escort).                           - Repeat colonoscopy in 10 years for screening                            purposes.                           - Anusol HC cream BID for 7 days. If not effective,                            then can schedule for hemorrhoidal banding.                           - The findings and recommendations were discussed                            with the patient. Dr Particia Lather "Alan Ripper" Leonides Schanz,  07/04/2023 9:44:48 AM

## 2023-07-04 NOTE — Patient Instructions (Addendum)
  Thank you for letting us take care of your healthcare needs today. Please see handouts given to you on Hemorrhoids. New RX for Anusol cream sent to your Pharmacy.   YOU HAD AN ENDOSCOPIC PROCEDURE TODAY AT THE [facility]:   Refer to the procedure report that was given to you for any specific questions about what was found during the examination.  If the procedure report does not answer your questions, please call your gastroenterologist to clarify.  If you requested that your care partner not be given the details of your procedure findings, then the procedure report has been included in a sealed envelope for you to review at your convenience later.  YOU SHOULD EXPECT: Some feelings of bloating in the abdomen. Passage of more gas than usual.  Walking can help get rid of the air that was put into your GI tract during the procedure and reduce the bloating. If you had a lower endoscopy (such as a colonoscopy or flexible sigmoidoscopy) you may notice spotting of blood in your stool or on the toilet paper. If you underwent a bowel prep for your procedure, you may not have a normal bowel movement for a few days.  Please Note:  You might notice some irritation and congestion in your nose or some drainage.  This is from the oxygen used during your procedure.  There is no need for concern and it should clear up in a day or so.  SYMPTOMS TO REPORT IMMEDIATELY:  Following lower endoscopy (colonoscopy or flexible sigmoidoscopy):  Excessive amounts of blood in the stool  Significant tenderness or worsening of abdominal pains  Swelling of the abdomen that is new, acute  Fever of 100F or higher  \For urgent or emergent issues, a gastroenterologist can be reached at any hour by calling [phone]. Do not use MyChart messaging for urgent concerns.    DIET:  We do recommend a small meal at first, but then you may proceed to your regular diet.  Drink plenty of fluids but you should avoid  alcoholic beverages for 24 hours.  ACTIVITY:  You should plan to take it easy for the rest of today and you should NOT DRIVE or use heavy machinery until tomorrow (because of the sedation medicines used during the test).    FOLLOW UP: Our staff will call the number listed on your records the next business day following your procedure.  We will call around 7:15- 8:00 am to check on you and address any questions or concerns that you may have regarding the information given to you following your procedure. If we do not reach you, we will leave a message.     If any biopsies were taken you will be contacted by phone or by letter within the next 1-3 weeks.  Please call us at [phone] if you have not heard about the biopsies in 3 weeks.    SIGNATURES/CONFIDENTIALITY: You and/or your care partner have signed paperwork which will be entered into your electronic medical record.  These signatures attest to the fact that that the information above on your After Visit Summary has been reviewed and is understood.  Full responsibility of the confidentiality of this discharge information lies with you and/or your care-partner.

## 2023-07-04 NOTE — Progress Notes (Signed)
GASTROENTEROLOGY PROCEDURE H&P NOTE   Primary Care Physician: Jarold Motto, PA    Reason for Procedure:   Colon cancer screening  Plan:    Colonoscopy  Patient is appropriate for endoscopic procedure(s) in the ambulatory ([facility]) setting.  The nature of the procedure, as well as the risks, benefits, and alternatives were carefully and thoroughly reviewed with the patient. Ample time for discussion and questions allowed. The patient understood, was satisfied, and agreed to proceed.     HPI: Sara [name] is a 52 y.o. female who presents for colonoscopy for colon cancer screening. Denies blood in stools, changes in bowel habits, or unintentional weight loss. Denies family history of colon cancer. Grandfather had colon cancer in his 22s.  Past Medical History:  Diagnosis Date   Allergy    Anemia    Genetic testing 12/13/2014   Heart murmur    as a child   History of shingles 10/25/1993   PONV (postoperative nausea and vomiting)     Past Surgical History:  Procedure Laterality Date   bilateral bunionectomy     CESAREAN SECTION  10/25/2005   COSMETIC SURGERY  2007   Breasts   PLACEMENT OF BREAST IMPLANTS  10/25/2005   VENTRAL HERNIA REPAIR N/A 09/05/2014   Procedure:  OPEN HERNIA REPAIR VENTRAL ADULT;  Surgeon: Emelia Loron, MD;  Location: [facility] OR;  Service: General;  Laterality: N/A;   wisdom teeth extracted      Prior to Admission medications   Medication Sig Start Date End Date Taking? Authorizing Provider  ALPRAZolam (XANAX) 0.25 MG tablet Take 0.25 mg by mouth 2 (two) times daily as needed. 06/16/22  Yes [provider]  CRANBERRY PO Take 1 tablet by mouth daily.   Yes [provider]  D-MANNOSE PO Take by mouth.   Yes [provider]  norgestrel-ethinyl estradiol (LO/OVRAL,CRYSELLE) 0.3-30 MG-MCG tablet Take 1 tablet by mouth daily.   Yes [provider]  COLLAGEN PO Take 600 mg by mouth daily in the afternoon. Patient not  taking: Reported on 06/06/2023    [provider]    Current Outpatient Medications  Medication Sig Dispense Refill   ALPRAZolam (XANAX) 0.25 MG tablet Take 0.25 mg by mouth 2 (two) times daily as needed.     CRANBERRY PO Take 1 tablet by mouth daily.     D-MANNOSE PO Take by mouth.     norgestrel-ethinyl estradiol (LO/OVRAL,CRYSELLE) 0.3-30 MG-MCG tablet Take 1 tablet by mouth daily.     COLLAGEN PO Take 600 mg by mouth daily in the afternoon. (Patient not taking: Reported on 06/06/2023)     Current Facility-Administered Medications  Medication Dose Route Frequency Provider Last Rate Last Admin   0.9 %  sodium chloride infusion  500 mL Intravenous Once Imogene Burn, MD        Allergies as of 07/04/2023 - Review Complete 07/04/2023  Allergen Reaction Noted   Mushroom extract complex Nausea And Vomiting 08/26/2014   Sulfa antibiotics Hives 04/23/2014    Family History  Problem Relation Age of Onset   Breast cancer Mother 30   Leukemia Mother        from chemotherapy   Cancer Mother    Glaucoma Father    Heart disease Brother 110       history of drug and ETOH abuse   CAD Brother    Early death Brother    Ovarian cancer Maternal Aunt 63   CAD Maternal Grandfather    Leukemia Paternal Grandmother  Colon cancer Paternal Grandfather        diagnosed in his 87s   Colon polyps Neg Hx    Esophageal cancer Neg Hx    Stomach cancer Neg Hx    Rectal cancer Neg Hx     Social History   Socioeconomic History   Marital status: Married    Spouse name: Not on file   Number of children: Not on file   Years of education: Not on file   Highest education level: Not on file  Occupational History   Not on file  Tobacco Use   Smoking status: Never   Smokeless tobacco: Never  Vaping Use   Vaping status: Never Used  Substance and Sexual Activity   Alcohol use: Yes    Alcohol/week: 3.0 standard drinks of alcohol    Types: 3 Glasses of wine per week    Comment: socially    Drug use: No   Sexual activity: Yes    Birth control/protection: Pill  Other Topics Concern   Not on file  Social History Narrative   Works for [employer] with husband and 68 yo daughter   Social Determinants of Health   Financial Resource Strain: Not on file  Food Insecurity: Not on file  Transportation Needs: Not on file  Physical Activity: Not on file  Stress: Not on file  Social Connections: Unknown (03/09/2022)   Received from [facility]   Social Network    Social Network: Not on file  Intimate Partner Violence: Unknown (01/29/2022)   Received from [facility]   HITS    Physically Hurt: Not on file    Insult or Talk Down To: Not on file    Threaten Physical Harm: Not on file    Scream or Curse: Not on file    Physical Exam: Vital signs in last 24 hours: BP 136/88   Pulse (!) 52   Temp 97.7 F (36.5 C)   Ht 5\' 6"  (1.676 m)   Wt 135 lb (61.2 kg)   SpO2 100%   BMI 21.79 kg/m  GEN: NAD EYE: Sclerae anicteric ENT: MMM CV: Non-tachycardic Pulm: No increased work of breathing GI: Soft, NT/ND NEURO:  Alert & Oriented   Eulah Pont, MD [facility]  07/04/2023 9:17 AM

## 2023-07-04 NOTE — Progress Notes (Signed)
Report to PACU, RN, vss, BBS= Clear.  

## 2023-07-05 ENCOUNTER — Telehealth: Payer: Self-pay

## 2023-07-05 NOTE — Telephone Encounter (Signed)
Left message on answering machine. 

## 2023-10-17 ENCOUNTER — Encounter: Payer: Self-pay | Admitting: Physician Assistant

## 2023-10-17 ENCOUNTER — Ambulatory Visit: Payer: BC Managed Care – PPO | Admitting: Physician Assistant

## 2023-10-17 VITALS — BP 120/70 | HR 74 | Temp 97.7°F | Ht 66.0 in | Wt 136.2 lb

## 2023-10-17 DIAGNOSIS — J029 Acute pharyngitis, unspecified: Secondary | ICD-10-CM | POA: Diagnosis not present

## 2023-10-17 LAB — POCT RAPID STREP A (OFFICE): Rapid Strep A Screen: NEGATIVE

## 2023-10-17 NOTE — Progress Notes (Signed)
Sara [name] is a 52 y.o. female here for a new problem.  History of Present Illness:   Chief Complaint  Patient presents with   Sinus Problem    Pt c/o sinus congestion, pressure, headache, light green nasal drainage, laryngitis, cough first thing in the morning, sputum thick light green.    HPI  Sinus issue: Pt complains of sinus/nasal congestion, sinus pressure, ear pressure, and hoarseness starting Wednesday (12/18).  States her daughter was sick for 2 days recently and tested negative for Covid.  Pt has also tested negative for covid 4 times.  Nasal congestion worsens at nighttime and often wakes her up at 3 am.  Currently taking Tylenol sinus to help relive her symptoms.  No hx of asthma. Pt notes she can tell she is getting better, but it has been little by little.   Past Medical History:  Diagnosis Date   Allergy    Anemia    Genetic testing 12/13/2014   Heart murmur    as a child   History of shingles 10/25/1993   PONV (postoperative nausea and vomiting)      Social History   Tobacco Use   Smoking status: Never   Smokeless tobacco: Never  Vaping Use   Vaping status: Never Used  Substance Use Topics   Alcohol use: Yes    Alcohol/week: 3.0 standard drinks of alcohol    Types: 3 Glasses of wine per week    Comment: socially   Drug use: No    Past Surgical History:  Procedure Laterality Date   bilateral bunionectomy     CESAREAN SECTION  10/25/2005   COSMETIC SURGERY  2007   Breasts   PLACEMENT OF BREAST IMPLANTS  10/25/2005   VENTRAL HERNIA REPAIR N/A 09/05/2014   Procedure:  OPEN HERNIA REPAIR VENTRAL ADULT;  Surgeon: Emelia Loron, MD;  Location: [facility] OR;  Service: General;  Laterality: N/A;   wisdom teeth extracted      Family History  Problem Relation Age of Onset   Breast cancer Mother 45   Leukemia Mother        from chemotherapy   Cancer Mother    Glaucoma Father    Heart disease Brother 48       history of drug and ETOH abuse   CAD  Brother    Early death Brother    Ovarian cancer Maternal Aunt 11   CAD Maternal Grandfather    Leukemia Paternal Grandmother    Colon cancer Paternal Grandfather        diagnosed in his 85s   Colon polyps Neg Hx    Esophageal cancer Neg Hx    Stomach cancer Neg Hx    Rectal cancer Neg Hx     Allergies  Allergen Reactions   Mushroom Extract Complex (Do Not Select) Nausea And Vomiting   Sulfa Antibiotics Hives    Current Medications:   Current Outpatient Medications:    ALPRAZolam (XANAX) 0.25 MG tablet, Take 0.25 mg by mouth 2 (two) times daily as needed., Disp: , Rfl:    CRANBERRY PO, Take 1 tablet by mouth daily., Disp: , Rfl:    D-MANNOSE PO, Take by mouth., Disp: , Rfl:    norgestrel-ethinyl estradiol (LO/OVRAL,CRYSELLE) 0.3-30 MG-MCG tablet, Take 1 tablet by mouth daily., Disp: , Rfl:    Review of Systems:   ROS Negative unless otherwise specified per HPI.  Vitals:   Vitals:   10/17/23 1129  BP: 120/70  Pulse: 74  Temp: 97.7 F (36.5  C)  TempSrc: Temporal  SpO2: 99%  Weight: 136 lb 4 oz (61.8 kg)  Height: 5\' 6"  (1.676 m)     Body mass index is 21.99 kg/m.  Physical Exam:   Physical Exam Vitals and nursing note reviewed.  Constitutional:      General: She is not in acute distress.    Appearance: She is well-developed. She is not ill-appearing or toxic-appearing.  HENT:     Head: Normocephalic and atraumatic.     Right Ear: Tympanic membrane, ear canal and external ear normal. Tympanic membrane is not erythematous, retracted or bulging.     Left Ear: Tympanic membrane, ear canal and external ear normal. Tympanic membrane is not erythematous, retracted or bulging.     Nose: Nose normal.     Right Sinus: No maxillary sinus tenderness or frontal sinus tenderness.     Left Sinus: No maxillary sinus tenderness or frontal sinus tenderness.     Mouth/Throat:     Pharynx: Uvula midline. Posterior oropharyngeal erythema present.  Eyes:     General: Lids are  normal.     Conjunctiva/sclera: Conjunctivae normal.  Neck:     Trachea: Trachea normal.  Cardiovascular:     Rate and Rhythm: Normal rate and regular rhythm.     Heart sounds: Normal heart sounds, S1 normal and S2 normal.  Pulmonary:     Effort: Pulmonary effort is normal.     Breath sounds: Normal breath sounds. No decreased breath sounds, wheezing, rhonchi or rales.  Lymphadenopathy:     Cervical: No cervical adenopathy.  Skin:    General: Skin is warm and dry.  Neurological:     Mental Status: She is alert.  Psychiatric:        Speech: Speech normal.        Behavior: Behavior normal. Behavior is cooperative.    Results for orders placed or performed in visit on 10/17/23  POCT rapid strep A  Result Value Ref Range   Rapid Strep A Screen Negative Negative     Assessment and Plan:   Sore throat No red flags on exam.   Strep test negative Suspect viral upper respiratory infection (URI)  Discussed vocal rest, hot fluids with honey Reviewed return precautions including new or worsening fever, SOB, new or worsening cough or other concerns.  Push fluids and rest.  I recommend that patient follow-up if symptoms worsen or persist despite treatment x 7-10 days, sooner if needed.  I, Isabelle Course, acting as a Neurosurgeon for Jarold Motto, [place]., have documented all relevant documentation on the behalf of Jarold Motto, [place], as directed by  Jarold Motto, PA while in the presence of Jarold Motto, [place].  I, Isabelle Course, have reviewed all documentation for this visit. The documentation on 10/17/23 for the exam, diagnosis, procedures, and orders are all accurate and complete.  Jarold Motto, PA-C

## 2023-10-17 NOTE — Patient Instructions (Signed)
It was great to see you!  You have a viral upper respiratory infection. Antibiotics are not needed for this.  Viral infections usually take 7-10 days to resolve.  The cough can last a few weeks to go away.   Push fluids and get plenty of rest. Please return if you are not improving as expected, or if you have high fevers (>101.5) or difficulty swallowing or worsening productive cough.  Call clinic with questions.  I hope you start feeling better soon!  

## 2023-10-20 ENCOUNTER — Telehealth: Payer: Self-pay

## 2023-10-20 ENCOUNTER — Ambulatory Visit: Payer: Self-pay | Admitting: Physician Assistant

## 2023-10-20 ENCOUNTER — Encounter: Payer: Self-pay | Admitting: Physician Assistant

## 2023-10-20 ENCOUNTER — Other Ambulatory Visit: Payer: Self-pay | Admitting: Physician Assistant

## 2023-10-20 ENCOUNTER — Ambulatory Visit: Payer: BC Managed Care – PPO | Admitting: Family

## 2023-10-20 MED ORDER — AMOXICILLIN-POT CLAVULANATE 875-125 MG PO TABS: 1.0000 | ORAL_TABLET | Freq: Two times a day (BID) | ORAL | 0 refills | Status: DC

## 2023-10-20 NOTE — Telephone Encounter (Signed)
  Chief Complaint: sinus congestion, cough Symptoms: sinus pressure/pain, left ear pressure, dry cough (worsened at night), nasal congestion and intermittent runny  nose, clear bloody nasal discharge Frequency: x 1 week Pertinent Negatives: Patient denies fever, body aches, chills, headaches, chest pain, SOB, dizziness  Disposition: [] ED /[] Urgent Care (no appt availability in office) / [] Appointment(In office/virtual)/ []  [facility]/ [] Home Care/ [] Refused Recommended Disposition /[] [facility]/ []  Follow-up with PCP Additional Notes: Pt states she was seen on 12/23 and given home care advice. Pt states her sore throat, hoarse voice and chest congestion have improved but now presents with symptoms of sinus pain and congestion. Pt states she has been taking OTC muccinex cold and flu without improvement of symptoms. Pt states at 3am severe cough and runny nose which keep her up. Pt agreeable to OV today; verbalizes understanding of home care and concerning symptoms to call back for.   Copied from CRM [id number]. Topic: Clinical - Medical Advice >> Oct 20, 2023  8:06 AM Truddie Crumble wrote: Reason for CRM: pt called stating she saw the provider on Monday for cold issues and she was tested for strep and it was negative, but pt has the same symptoms that she had on Monday but now she has blood in her sinuses Reason for Disposition  Earache  Answer Assessment - Initial Assessment Questions 1. LOCATION: "Where does it hurt?"      Behind eyes and radiating into left ear; pressure.  2. ONSET: "When did the sinus pain start?"  (e.g., hours, days)      X 1 week  3. SEVERITY: "How bad is the pain?"   (Scale 1-10; mild, moderate or severe)   - MILD (1-3): doesn't interfere with normal activities    - MODERATE (4-7): interferes with normal activities (e.g., work or school) or awakens from sleep   - SEVERE (8-10): excruciating pain and patient unable to do any normal activities         5-6/10.  4. RECURRENT SYMPTOM: "Have you ever had sinus problems before?" If Yes, ask: "When was the last time?" and "What happened that time?"      Pt states she has never had a sinus infection; states she has had similar cough symptoms with COVID a few years ago.  5. NASAL CONGESTION: "Is the nose blocked?" If Yes, ask: "Can you open it or must you breathe through your mouth?"     Pt states her nose "stays really congested then goes thru cycles of running".  6. NASAL DISCHARGE: "Do you have discharge from your nose?" If so ask, "What color?"     Pt states it was green and now is clear with blood. Pt states light pink and specks of blood, however pt states it was red and heavier this AM around 3am.  7. FEVER: "Do you have a fever?" If Yes, ask: "What is it, how was it measured, and when did it start?"      Denies.  8. OTHER SYMPTOMS: "Do you have any other symptoms?" (e.g., sore throat, cough, earache, difficulty breathing)     Dry cough, nasal congestion, sinus pressure. Pt states symptoms started with productive cough, hoarse voice, sore throat, and chest congestion but states those symptoms have resolved.  Protocols used: Sinus Pain or Congestion-A-AH

## 2023-10-20 NOTE — Telephone Encounter (Signed)
Reason for CRM: pt called stating she saw the provider on Monday for cold issues and she was tested for strep and it was negative, but pt has the same symptoms that she had on Monday but now she has blood in her sinuses

## 2023-10-20 NOTE — Telephone Encounter (Signed)
Thank You.

## 2023-10-20 NOTE — Telephone Encounter (Signed)
Noted  

## 2023-11-08 DIAGNOSIS — Z124 Encounter for screening for malignant neoplasm of cervix: Secondary | ICD-10-CM | POA: Diagnosis not present

## 2023-11-08 DIAGNOSIS — Z6822 Body mass index (BMI) 22.0-22.9, adult: Secondary | ICD-10-CM | POA: Diagnosis not present

## 2023-11-08 DIAGNOSIS — N390 Urinary tract infection, site not specified: Secondary | ICD-10-CM | POA: Diagnosis not present

## 2023-11-08 DIAGNOSIS — R309 Painful micturition, unspecified: Secondary | ICD-10-CM | POA: Diagnosis not present

## 2023-11-08 DIAGNOSIS — Z01419 Encounter for gynecological examination (general) (routine) without abnormal findings: Secondary | ICD-10-CM | POA: Diagnosis not present

## 2023-11-23 DIAGNOSIS — M25572 Pain in left ankle and joints of left foot: Secondary | ICD-10-CM | POA: Diagnosis not present

## 2023-12-28 ENCOUNTER — Encounter: Payer: BC Managed Care – PPO | Admitting: Physician Assistant

## 2024-01-20 ENCOUNTER — Encounter: Payer: BC Managed Care – PPO | Admitting: Physician Assistant

## 2024-01-25 ENCOUNTER — Ambulatory Visit: Payer: BC Managed Care – PPO | Admitting: Physician Assistant

## 2024-02-22 ENCOUNTER — Encounter: Admitting: Physician Assistant

## 2024-03-12 DIAGNOSIS — M7742 Metatarsalgia, left foot: Secondary | ICD-10-CM | POA: Diagnosis not present

## 2024-03-13 ENCOUNTER — Encounter: Admitting: Physician Assistant

## 2024-03-26 DIAGNOSIS — L821 Other seborrheic keratosis: Secondary | ICD-10-CM | POA: Diagnosis not present

## 2024-03-26 DIAGNOSIS — L718 Other rosacea: Secondary | ICD-10-CM | POA: Diagnosis not present

## 2024-03-26 DIAGNOSIS — L814 Other melanin hyperpigmentation: Secondary | ICD-10-CM | POA: Diagnosis not present

## 2024-03-28 ENCOUNTER — Encounter: Admitting: Physician Assistant

## 2024-05-03 ENCOUNTER — Ambulatory Visit (INDEPENDENT_AMBULATORY_CARE_PROVIDER_SITE_OTHER): Admitting: Physician Assistant

## 2024-05-03 ENCOUNTER — Encounter: Payer: Self-pay | Admitting: Physician Assistant

## 2024-05-03 VITALS — BP 110/70 | HR 52 | Temp 97.7°F | Ht 65.5 in | Wt 135.4 lb

## 2024-05-03 DIAGNOSIS — Z136 Encounter for screening for cardiovascular disorders: Secondary | ICD-10-CM

## 2024-05-03 DIAGNOSIS — Z13220 Encounter for screening for lipoid disorders: Secondary | ICD-10-CM

## 2024-05-03 DIAGNOSIS — Z0000 Encounter for general adult medical examination without abnormal findings: Secondary | ICD-10-CM | POA: Diagnosis not present

## 2024-05-03 DIAGNOSIS — R252 Cramp and spasm: Secondary | ICD-10-CM

## 2024-05-03 LAB — COMPREHENSIVE METABOLIC PANEL WITH GFR
ALT: 20 U/L (ref 0–35)
AST: 20 U/L (ref 0–37)
Albumin: 4.2 g/dL (ref 3.5–5.2)
Alkaline Phosphatase: 62 U/L (ref 39–117)
BUN: 17 mg/dL (ref 6–23)
CO2: 27 meq/L (ref 19–32)
Calcium: 9.2 mg/dL (ref 8.4–10.5)
Chloride: 104 meq/L (ref 96–112)
Creatinine, Ser: 0.9 mg/dL (ref 0.40–1.20)
GFR: 74.03 mL/min (ref >60.00)
Glucose, Bld: 87 mg/dL (ref 70–99)
Potassium: 4.2 meq/L (ref 3.5–5.1)
Sodium: 137 meq/L (ref 135–145)
Total Bilirubin: 0.5 mg/dL (ref 0.2–1.2)
Total Protein: 6.4 g/dL (ref 6.0–8.3)

## 2024-05-03 LAB — CBC WITH DIFFERENTIAL/PLATELET
Basophils Absolute: 0.1 K/uL (ref 0.0–0.1)
Basophils Relative: 1.1 % (ref 0.0–3.0)
Eosinophils Absolute: 0.1 K/uL (ref 0.0–0.7)
Eosinophils Relative: 2.3 % (ref 0.0–5.0)
HCT: 34 % — ABNORMAL LOW (ref 36.0–46.0)
Hemoglobin: 11 g/dL — ABNORMAL LOW (ref 12.0–15.0)
Lymphocytes Relative: 29.9 % (ref 12.0–46.0)
Lymphs Abs: 1.9 K/uL (ref 0.7–4.0)
MCHC: 32.4 g/dL (ref 30.0–36.0)
MCV: 74.3 fl — ABNORMAL LOW (ref 78.0–100.0)
Monocytes Absolute: 0.4 K/uL (ref 0.1–1.0)
Monocytes Relative: 6 % (ref 3.0–12.0)
Neutro Abs: 3.8 K/uL (ref 1.4–7.7)
Neutrophils Relative %: 60.7 % (ref 43.0–77.0)
Platelets: 187 K/uL (ref 150.0–400.0)
RBC: 4.58 Mil/uL (ref 3.87–5.11)
RDW: 18.2 % — ABNORMAL HIGH (ref 11.5–15.5)
WBC: 6.2 K/uL (ref 4.0–10.5)

## 2024-05-03 LAB — LIPID PANEL
Cholesterol: 149 mg/dL (ref 0–200)
HDL: 66.2 mg/dL (ref >39.00)
LDL Cholesterol: 58 mg/dL (ref 0–99)
NonHDL: 82.8
Total CHOL/HDL Ratio: 2
Triglycerides: 124 mg/dL (ref 0.0–149.0)
VLDL: 24.8 mg/dL (ref 0.0–40.0)

## 2024-05-03 NOTE — Patient Instructions (Addendum)
 It was great to see you!  Try magnesium glycinate Recommend shingles vaccine Consider pneumonia vaccine  Please go to the lab for blood work.   Our office will call you with your results unless you have chosen to receive results via MyChart.  If your blood work is normal we will follow-up each year for physicals and as scheduled for chronic medical problems.  If anything is abnormal we will treat accordingly and get you in for a follow-up.  Take care,  Silvana Holecek

## 2024-05-03 NOTE — Progress Notes (Signed)
 Subjective:    Sara [name] is a 53 y.o. female and is here for a comprehensive physical exam.  HPI  Health Maintenance Due  Topic Date Due   HIV Screening  Never done   Hepatitis B Vaccines (1 of 3 - 19+ 3-dose series) Never done    Acute Concerns: Muscle cramping Pt complains of muscle cramping in both legs, from her ankles through her calves..  She describes the pain as a charley horse which wakes her up in the middle of the night and she has to walk it out. She reports she has no problems whenever she exercises a lot during the day or whenever she does a lot of rolling and stretching Endorses starting electrolyte replacement recently  Chronic Issues: Anxiety  Pt is on Xanax 0.25 mg BID as needed. 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 Anxiety is well-managed. No acute concerns reported today.  Birth control  Pt is on Norgestrel-Ethinyl Estradiol. She reports she still gets regular menstrual cycles. No acute concerns reported today.  Health Maintenance: Immunizations -- See above. Colonoscopy -- UTD, 07/04/2023 and findings include non-bleeding internal hemorrhoids. Recommended repeat in 2034. Mammogram -- UTD, 11/08/2023. PAP -- UTD, 11/08/2023. Bone Density -- N/a Diet -- Overall healthy diet. Avoids sugary foods. Exercise -- Daily exercise, cardio and strength training. Also working on flexibility recently.  Sleep habits -- Muscle cramps in her legs sometimes wake her up in the middle of the night. Mood -- Stable.  UTD with dentist? - UTD UTD with eye doctor? - UTD UTD with dermatology? - UTD  Weight history: Wt Readings from Last 10 Encounters:  05/03/24 135 lb 6.1 oz (61.4 kg)  10/17/23 136 lb 4 oz (61.8 kg)  07/04/23 135 lb (61.2 kg)  06/06/23 135 lb (61.2 kg)  09/29/22 137 lb 6.1 oz (62.3 kg)  08/03/21 136 lb 12.8 oz (62.1 kg)  05/25/21 130 lb (59 kg)  07/28/20 128 lb 12.8 oz (58.4 kg)  09/05/14  128 lb 11.2 oz (58.4 kg)  08/28/14 128 lb 11.2 oz (58.4 kg)   Body mass index is 22.19 kg/m. Patient's last menstrual period was 04/20/2024 (exact date).  Alcohol use:  reports current alcohol use of about 3.0 standard drinks of alcohol per week.  Tobacco use:  Tobacco Use: Low Risk  (05/03/2024)   Patient History    Smoking Tobacco Use: Never    Smokeless Tobacco Use: Never    Passive Exposure: Not on file   Eligible for lung cancer screening? no     05/03/2024    8:00 AM  Depression screen PHQ 2/9  Decreased Interest 0  Down, Depressed, Hopeless 0  PHQ - 2 Score 0     Other providers/specialists: Patient Care Team: Job Lukes, [place] as PCP - General (Physician Assistant)    PMHx, SurgHx, SocialHx, Medications, and Allergies were reviewed in the Visit Navigator and updated as appropriate.   Past Medical History:  Diagnosis Date   Allergy    Anemia    Genetic testing 12/13/2014   Heart murmur    as a child   History of shingles 10/25/1993   PONV (postoperative nausea and vomiting)      Past Surgical History:  Procedure Laterality Date   bilateral bunionectomy     CESAREAN SECTION  10/25/2005   COSMETIC SURGERY  2007   Breasts   PLACEMENT OF BREAST IMPLANTS  10/25/2005   VENTRAL HERNIA REPAIR N/A  09/05/2014   Procedure:  OPEN HERNIA REPAIR VENTRAL ADULT;  Surgeon: Donnice Bury, MD;  Location: [facility] OR;  Service: General;  Laterality: N/A;   wisdom teeth extracted       Family History  Problem Relation Age of Onset   Breast cancer Mother 101   Leukemia Mother        from chemotherapy   Cancer Mother    Glaucoma Father    Heart disease Brother 53       history of drug and ETOH abuse   CAD Brother    Early death Brother    CAD Maternal Grandfather    Leukemia Paternal Grandmother    Colon cancer Paternal Grandfather        diagnosed in his 80s   Ovarian cancer Maternal Aunt 78   Colon polyps Neg Hx    Esophageal cancer Neg Hx    Stomach  cancer Neg Hx    Rectal cancer Neg Hx     Social History   Tobacco Use   Smoking status: Never   Smokeless tobacco: Never  Vaping Use   Vaping status: Never Used  Substance Use Topics   Alcohol use: Yes    Alcohol/week: 3.0 standard drinks of alcohol    Types: 3 Glasses of wine per week    Comment: socially   Drug use: No    Review of Systems:   Review of Systems  Constitutional:  Negative for chills, fever, malaise/fatigue and weight loss.  HENT:  Negative for hearing loss, sinus pain and sore throat.   Respiratory:  Negative for cough and hemoptysis.   Cardiovascular:  Negative for chest pain, palpitations, leg swelling and PND.  Gastrointestinal:  Negative for abdominal pain, constipation, diarrhea, heartburn, nausea and vomiting.  Genitourinary:  Negative for dysuria, frequency and urgency.  Musculoskeletal:  Negative for back pain, myalgias and neck pain.  Skin:  Negative for itching and rash.  Neurological:  Negative for dizziness, tingling, seizures and headaches.  Endo/Heme/Allergies:  Negative for polydipsia.  Psychiatric/Behavioral:  Negative for depression. The patient is not nervous/anxious.       Objective:   BP 110/70 (BP Location: Left Arm, Patient Position: Sitting, Cuff Size: Normal)   Pulse (!) 52   Temp 97.7 F (36.5 C) (Temporal)   Ht 5' 5.5 (1.664 m)   Wt 135 lb 6.1 oz (61.4 kg)   LMP 04/20/2024 (Exact Date)   SpO2 99%   BMI 22.19 kg/m  Body mass index is 22.19 kg/m.   General Appearance:    Alert, cooperative, no distress, appears stated age  Head:    Normocephalic, without obvious abnormality, atraumatic  Eyes:    PERRL, conjunctiva/corneas clear, EOM's intact, fundi    benign, both eyes  Ears:    Normal TM's and external ear canals, both ears  Nose:   Nares normal, septum midline, mucosa normal, no drainage    or sinus tenderness  Throat:   Lips, mucosa, and tongue normal; teeth and gums normal  Neck:   Supple, symmetrical, trachea  midline, no adenopathy;    thyroid :  no enlargement/tenderness/nodules; no carotid   bruit or JVD  Back:     Symmetric, no curvature, ROM normal, no CVA tenderness  Lungs:     Clear to auscultation bilaterally, respirations unlabored  Chest Wall:    No tenderness or deformity   Heart:    Regular rate and rhythm, S1 and S2 normal, no murmur, rub or gallop  Breast Exam:  Deferred  Abdomen:     Soft, non-tender, bowel sounds active all four quadrants,    no masses, no organomegaly  Genitalia:    Deferred  Extremities:   Extremities normal, atraumatic, no cyanosis or edema  Pulses:   2+ and symmetric all extremities  Skin:   Skin color, texture, turgor normal, no rashes or lesions  Lymph nodes:   Cervical, supraclavicular, and axillary nodes normal  Neurologic:   CNII-XII intact, normal strength, sensation and reflexes    throughout    Assessment/Plan:   Routine physical examination Today patient counseled on age appropriate routine health concerns for screening and prevention, each reviewed and up to date or declined. Immunizations reviewed and up to date or declined. Labs ordered and reviewed. Risk factors for depression reviewed and negative. Hearing function and visual acuity are intact. ADLs screened and addressed as needed. Functional ability and level of safety reviewed and appropriate. Education, counseling and referrals performed based on assessed risks today. Patient provided with a copy of personalized plan for preventive services.  Encounter for lipid screening for cardiovascular disease Update lipid panel Calcium score in 2024 was zero  Muscle cramping Update blood work to rule out organic cause Consider adding magnesium glycinate Recommend close follow up with us  if persists  I, Lavern Simmers, acting as a Neurosurgeon for [employer], [place]., have documented all relevant documentation on the behalf of Lucie Buttner, [place], as directed by Lucie Buttner, PA while in the presence  of Lucie Buttner, [place].  I, Lucie Buttner, [place], have reviewed all documentation for this visit. The documentation on 05/03/24 for the exam, diagnosis, procedures, and orders are all accurate and complete.  Lucie Buttner, PA-C [facility]

## 2024-05-04 ENCOUNTER — Ambulatory Visit: Payer: Self-pay | Admitting: Physician Assistant

## 2024-05-04 DIAGNOSIS — D649 Anemia, unspecified: Secondary | ICD-10-CM

## 2024-05-04 NOTE — Telephone Encounter (Signed)
Please review patient message

## 2024-07-03 DIAGNOSIS — M205X2 Other deformities of toe(s) (acquired), left foot: Secondary | ICD-10-CM | POA: Diagnosis not present

## 2024-07-03 DIAGNOSIS — G5762 Lesion of plantar nerve, left lower limb: Secondary | ICD-10-CM | POA: Diagnosis not present

## 2024-07-03 DIAGNOSIS — M216X9 Other acquired deformities of unspecified foot: Secondary | ICD-10-CM | POA: Diagnosis not present

## 2024-07-10 ENCOUNTER — Other Ambulatory Visit (HOSPITAL_BASED_OUTPATIENT_CLINIC_OR_DEPARTMENT_OTHER): Payer: Self-pay

## 2024-07-10 MED ORDER — NITROFURANTOIN MONOHYD MACRO 100 MG PO CAPS
ORAL_CAPSULE | ORAL | 0 refills | Status: AC
  Filled 2024-07-10: qty 10, 5d supply, fill #0

## 2024-08-20 DIAGNOSIS — D2261 Melanocytic nevi of right upper limb, including shoulder: Secondary | ICD-10-CM | POA: Diagnosis not present

## 2024-08-20 DIAGNOSIS — L821 Other seborrheic keratosis: Secondary | ICD-10-CM | POA: Diagnosis not present

## 2024-08-20 DIAGNOSIS — L718 Other rosacea: Secondary | ICD-10-CM | POA: Diagnosis not present

## 2024-08-20 DIAGNOSIS — D2262 Melanocytic nevi of left upper limb, including shoulder: Secondary | ICD-10-CM | POA: Diagnosis not present

## 2024-08-20 DIAGNOSIS — L820 Inflamed seborrheic keratosis: Secondary | ICD-10-CM | POA: Diagnosis not present

## 2024-08-20 DIAGNOSIS — D485 Neoplasm of uncertain behavior of skin: Secondary | ICD-10-CM | POA: Diagnosis not present

## 2024-10-03 ENCOUNTER — Ambulatory Visit: Admitting: Podiatry

## 2024-10-03 ENCOUNTER — Encounter: Payer: Self-pay | Admitting: Podiatry

## 2024-10-03 ENCOUNTER — Ambulatory Visit (INDEPENDENT_AMBULATORY_CARE_PROVIDER_SITE_OTHER)

## 2024-10-03 DIAGNOSIS — M2042 Other hammer toe(s) (acquired), left foot: Secondary | ICD-10-CM

## 2024-10-03 DIAGNOSIS — M2012 Hallux valgus (acquired), left foot: Secondary | ICD-10-CM

## 2024-10-03 DIAGNOSIS — M7742 Metatarsalgia, left foot: Secondary | ICD-10-CM

## 2024-10-03 NOTE — Progress Notes (Signed)
 Subjective:  Patient ID: Sara [name], female    DOB: 11/04/1970,   MRN: [id number]  Chief Complaint  Patient presents with   Foot Pain    I had bunion surgery 30 years ago.  Now the left one is starting to bother me.  It started out with Metatarsal pain.  I have this Hammer Toe.  Now, my arch is starting to hurt.  I went to [facility] and he gave me a shot.  I went somewhere else and they gave me a shot.  So now I'm here.  I want to see if she can do anything different. (1st and 2nd digits left)    53 y.o. female presents for concern as above . Denies any other pedal complaints. Denies n/v/f/c.   Past Medical History:  Diagnosis Date   Allergy    Anemia    Genetic testing 12/13/2014   Heart murmur    as a child   History of shingles 10/25/1993   PONV (postoperative nausea and vomiting)     Objective:  Physical Exam: Vascular: DP/PT pulses 2/4 bilateral. CFT <3 seconds. Normal hair growth on digits. No edema.  Skin. No lacerations or abrasions bilateral feet.  Musculoskeletal: MMT 5/5 bilateral lower extremities in DF, PF, Inversion and Eversion. Deceased ROM in DF of ankle joint.  Left HAV deformity reoccurrence.  Left second digit hammered.  Tenderness to palpation of plantar second metatarsal head as well as some pain dorsally.  Mild pain with range of motion of the second metatarsal.  Mild pain noted to the plantar 3rd and 4th metatarsal heads as well.  Some pain proximally noted around the first metatarsal base. Neurological: Sensation intact to light touch.   Assessment:   1. Hallux valgus of left foot   2. Hammertoe of left foot   3. Metatarsalgia, left foot      Plan:  Patient was evaluated and treated and all questions answered. -Xrays reviewed.  No acute fractures or dislocations noted.  Retained hardware from previous bunion surgery noted in the proximal first metatarsal and tracking into the first tarsometatarsal joint noted.  Recurrence of the HAV deformity  noted.  IM 1 to angle of 15 noted with sesamoid position of 5.  Elongated second metatarsal head.  Hammered second digit -Discussed HAV and treatment options;conservative and surgical management; risks, benefits, alternatives discussed. All patient's questions answered. -Discussed padding and wide shoe gear.   -Recommend continue with good supportive shoes and inserts.  -Discussed she has exhausted conservative treatment with multiple injections that have helped but only made deformities worse.  Discussed at this point could consider surgical options.  This would include removal of the hardware,  [place] bunionectomy second metatarsal shortening osteotomy, hammertoe repair of the second digit -Discussed surgical options and perioperative course in detail with patient.  Discussed patient would be in a boot and have pin in place for about 6 weeks after surgery.  She would be able to weight-bear on the heel as tolerated but minimally. -Informed surgical risk consent was reviewed and read aloud to the patient.  I reviewed the films.  I have discussed my findings with the patient in great detail.  I have discussed all risks including but not limited to infection, stiffness, scarring, limp, disability, deformity, damage to blood vessels and nerves, numbness, poor healing, need for braces, arthritis, chronic pain, amputation, death.  All benefits and realistic expectations discussed in great detail.  I have made no promises as to the outcome.  I  have provided realistic expectations.  I have offered the patient a 2nd opinion, which they have declined and assured me they preferred to proceed despite the risks. Plan for surgery in the coming year. Postop meds: Zofran , oxycodone  5 325 mg, Keflex   Shye Doty, DPM

## 2024-10-05 DIAGNOSIS — G2581 Restless legs syndrome: Secondary | ICD-10-CM | POA: Diagnosis not present

## 2024-10-05 DIAGNOSIS — I87392 Chronic venous hypertension (idiopathic) with other complications of left lower extremity: Secondary | ICD-10-CM | POA: Diagnosis not present

## 2024-10-05 DIAGNOSIS — M79662 Pain in left lower leg: Secondary | ICD-10-CM | POA: Diagnosis not present

## 2024-10-05 DIAGNOSIS — I872 Venous insufficiency (chronic) (peripheral): Secondary | ICD-10-CM | POA: Diagnosis not present

## 2024-10-05 DIAGNOSIS — L299 Pruritus, unspecified: Secondary | ICD-10-CM | POA: Diagnosis not present

## 2024-10-08 ENCOUNTER — Encounter: Payer: Self-pay | Admitting: Podiatry

## 2024-10-15 ENCOUNTER — Telehealth: Payer: Self-pay | Admitting: Podiatry

## 2024-10-15 NOTE — Telephone Encounter (Signed)
 Called and patient is scheduled for surgery on 11/06/2024. Patient not on any GLP1 or blood thinners, patient pharmacy correct in chart.

## 2024-10-30 ENCOUNTER — Telehealth: Payer: Self-pay | Admitting: Podiatry

## 2024-10-30 NOTE — Telephone Encounter (Signed)
 Patient called asking if we could issue a handicap placard. Is it ok to give patient a temporary one for use while recovering from surgery?

## 2024-10-30 NOTE — Telephone Encounter (Signed)
 DOS- 11/06/2024  [place] BUNIONECTOMY LT- 28296 2ND METATARSAL OSTEOTOMY LT- 28308 REMOVAL FIXATION DEEP KWIRE/SCREW LT- 20680 2ND HAMMERTOE REPAIR LT- 28285  ANTHEM VA EFFECTIVE DATE- 10/26/2019  DEDUCTIBLE- $700 REMAINING- $700 OOP- $3500 REMAINING- $3500 FAMILY DEDUCTIBLE- $1400 REMAINING- $1400 FAMILY OOP- $7000 REMAINING- $7000 COINSURANCE- 0%  PER AVAILITY PORTAL, PRIOR AUTH IS NOT REQUIRED FOR CPT CODES 71703, [id number], 20680, AND 71714.

## 2024-11-01 DIAGNOSIS — Z0279 Encounter for issue of other medical certificate: Secondary | ICD-10-CM

## 2024-11-02 NOTE — Telephone Encounter (Signed)
 S/w pt and will fax [facility] [phone] and email her a copy for her records RTW 01/01/24 DOS 11/06/24

## 2024-11-06 ENCOUNTER — Other Ambulatory Visit: Payer: Self-pay | Admitting: Podiatry

## 2024-11-06 DIAGNOSIS — M2042 Other hammer toe(s) (acquired), left foot: Secondary | ICD-10-CM | POA: Diagnosis not present

## 2024-11-06 DIAGNOSIS — M2012 Hallux valgus (acquired), left foot: Secondary | ICD-10-CM | POA: Diagnosis not present

## 2024-11-06 DIAGNOSIS — T8484XA Pain due to internal orthopedic prosthetic devices, implants and grafts, initial encounter: Secondary | ICD-10-CM | POA: Diagnosis not present

## 2024-11-06 DIAGNOSIS — M7742 Metatarsalgia, left foot: Secondary | ICD-10-CM | POA: Diagnosis not present

## 2024-11-06 MED ORDER — ONDANSETRON HCL 4 MG PO TABS: 4.0000 mg | ORAL_TABLET | Freq: Three times a day (TID) | ORAL | 0 refills | Status: AC | PRN

## 2024-11-06 MED ORDER — CEPHALEXIN 500 MG PO CAPS: 500.0000 mg | ORAL_CAPSULE | Freq: Three times a day (TID) | ORAL | 2 refills | Status: AC

## 2024-11-06 MED ORDER — OXYCODONE-ACETAMINOPHEN 5-325 MG PO TABS: 1.0000 | ORAL_TABLET | ORAL | 0 refills | Status: AC | PRN

## 2024-11-13 ENCOUNTER — Ambulatory Visit

## 2024-11-13 ENCOUNTER — Encounter: Payer: Self-pay | Admitting: Podiatry

## 2024-11-13 ENCOUNTER — Ambulatory Visit: Admitting: Podiatry

## 2024-11-13 DIAGNOSIS — Z9889 Other specified postprocedural states: Secondary | ICD-10-CM

## 2024-11-13 DIAGNOSIS — M2042 Other hammer toe(s) (acquired), left foot: Secondary | ICD-10-CM | POA: Diagnosis not present

## 2024-11-13 DIAGNOSIS — M2012 Hallux valgus (acquired), left foot: Secondary | ICD-10-CM

## 2024-11-13 DIAGNOSIS — T8484XA Pain due to internal orthopedic prosthetic devices, implants and grafts, initial encounter: Secondary | ICD-10-CM

## 2024-11-13 NOTE — Progress Notes (Signed)
 "  Subjective:  Patient ID: Sara [name], female    DOB: 1970-12-29,  MRN: [id number]  Chief Complaint  Patient presents with   Routine Post Op    POV #1 DOS 11/06/2024 LT AUTIN BUNIONECTOMY, LT 2ND METATARSAL OSTEOTOMY, LT REMOVAL DEEP FIXATION KWIRE/SCREW,LT 2ND HAMMER TOE REPAIR  It's doing good.  It hurt when you took the boot off.  My pain level is a five.  (No nausea vomiting, chills or fever experienced)    DOS: 11/06/24  Procedure: Left [place] bunionectomy, left foot hardware removal, left second digit hammertoe repair, left second metatarsal shortening osteotomy   54 y.o. female returns for POV#1. Relates doing well and managing pain.   Review of Systems: Negative except as noted in the HPI. Denies N/V/F/Ch.  Past Medical History:  Diagnosis Date   Allergy    Anemia    Genetic testing 12/13/2014   Heart murmur    as a child   History of shingles 10/25/1993   PONV (postoperative nausea and vomiting)    Current Medications[1]  Tobacco Use History[2]  Allergies[3] Objective:  There were no vitals filed for this visit. There is no height or weight on file to calculate BMI. Constitutional Well developed. Well nourished.  Vascular Foot warm and well perfused. Capillary refill normal to all digits.   Neurologic Normal speech. Oriented to person, place, and time. Epicritic sensation to light touch grossly present bilaterally.  Dermatologic Skin healing well without signs of infection. Skin edges well coapted without signs of infection.  Orthopedic: Tenderness to palpation noted about the surgical site.   Radiographs: Previous hardware removed and new hardware intact with toes well aligned Assessment:   1. Post-operative state    Plan:  Patient was evaluated and treated and all questions answered.  S/p foot surgery left -Progressing as expected post-operatively. -WB Status: Minimal WBAT in CAM boot  -Sutures: intact. -Medications: n/a -Foot redressed.  Return  in 2 weeks for suture removal.   No follow-ups on file.      [1]  Current Outpatient Medications:    ALPRAZolam (XANAX) 0.25 MG tablet, Take 0.25 mg by mouth 2 (two) times daily as needed., Disp: , Rfl:    cephALEXin  (KEFLEX ) 500 MG capsule, Take 1 capsule (500 mg total) by mouth 3 (three) times daily., Disp: 30 capsule, Rfl: 2   CRANBERRY PO, Take 1 tablet by mouth daily., Disp: , Rfl:    D-MANNOSE PO, Take by mouth., Disp: , Rfl:    Milk Thistle 300 MG CAPS, Take 1 capsule by mouth daily in the afternoon., Disp: , Rfl:    nitrofurantoin , macrocrystal-monohydrate, (MACROBID ) 100 MG capsule, 1 capsule by mouth twice a day, Disp: 10 capsule, Rfl: 0   norgestrel-ethinyl estradiol (LO/OVRAL,CRYSELLE) 0.3-30 MG-MCG tablet, Take 1 tablet by mouth daily., Disp: , Rfl:    Probiotic Product (PROBIOTIC DAILY PO), Take 1 capsule by mouth daily in the afternoon., Disp: , Rfl:    ondansetron  (ZOFRAN ) 4 MG tablet, Take 1 tablet (4 mg total) by mouth every 8 (eight) hours as needed for nausea or vomiting. (Patient not taking: Reported on 11/13/2024), Disp: 20 tablet, Rfl: 0   oxyCODONE -acetaminophen  (PERCOCET/ROXICET) 5-325 MG tablet, Take 1 tablet by mouth every 4 (four) hours as needed for up to 7 days for severe pain (pain score 7-10). (Patient not taking: Reported on 11/13/2024), Disp: 30 tablet, Rfl: 0 [2]  Social History Tobacco Use  Smoking Status Never  Smokeless Tobacco Never  [3]  Allergies Allergen Reactions  Mushroom Extract Complex (Obsolete) Nausea And Vomiting   Sulfa Antibiotics Hives   "

## 2024-11-15 ENCOUNTER — Encounter: Payer: Self-pay | Admitting: Podiatry

## 2024-11-16 DIAGNOSIS — Z0279 Encounter for issue of other medical certificate: Secondary | ICD-10-CM

## 2024-11-16 NOTE — Telephone Encounter (Signed)
 pt lft mess about form. Dr. Joya sent to her via MyChart. I called to confirm she recd it and she said she did and I adv her she can pay the fee on next visit 11/27/24  Request to work remote starting next week.

## 2024-11-27 ENCOUNTER — Encounter: Admitting: Podiatry

## 2024-11-27 ENCOUNTER — Encounter: Payer: Self-pay | Admitting: Podiatry

## 2024-11-27 ENCOUNTER — Ambulatory Visit: Admitting: Podiatry

## 2024-11-27 DIAGNOSIS — M2012 Hallux valgus (acquired), left foot: Secondary | ICD-10-CM | POA: Diagnosis not present

## 2024-11-27 DIAGNOSIS — Z9889 Other specified postprocedural states: Secondary | ICD-10-CM | POA: Diagnosis not present

## 2024-11-27 NOTE — Progress Notes (Signed)
"  °  Subjective:  Patient ID: Sara [name], female    DOB: [birth year],  MRN: [id number]  Chief Complaint  Patient presents with   Routine Post Op    POV #2 DOS 11/06/2024 LT [place] BUNIONECTOMY, LT 2ND METATARSAL OSTEOTOMY, LT REMOVAL DEEP FIXATION KWIRE/SCREW,LT 2ND HAMMER TOE It's good.     DOS: 11/06/24  Procedure: Left [place]in bunionectomy, left foot hardware removal, left second digit hammertoe repair, left second metatarsal shortening osteotomy   54 y.o. female returns for POV#1. Relates doing well and managing pain.   Review of Systems: Negative except as noted in the HPI. Denies N/V/F/Ch.  Past Medical History:  Diagnosis Date   Allergy    Anemia    Genetic testing 12/13/2014   Heart murmur    as a child   History of shingles 10/25/1993   PONV (postoperative nausea and vomiting)    Current Medications[1]  Tobacco Use History[2]  Allergies[3] Objective:  There were no vitals filed for this visit. There is no height or weight on file to calculate BMI. Constitutional Well developed. Well nourished.  Vascular Foot warm and well perfused. Capillary refill normal to all digits.   Neurologic Normal speech. Oriented to person, place, and time. Epicritic sensation to light touch grossly present bilaterally.  Dermatologic Skin healing well without signs of infection. Skin edges well coapted without signs of infection.  Orthopedic: Tenderness to palpation noted about the surgical site.   Radiographs: Previous hardware removed and new hardware intact with toes well aligned Assessment:   1. Post-operative state   2. Hallux valgus of left foot    Plan:  Patient was evaluated and treated and all questions answered.  S/p foot surgery left -Progressing as expected post-operatively. -WB Status: Minimal WBAT in CAM boot for three more weeks.  Surgical shoe dispensed to wear when sleeping to help sleep a little better and still protect pin.  -Sutures: removed today without  incident.  -Medications: n/a -Foot redressed.  Return in 3 weeks for recheck and pin removal.   Return in about 3 weeks (around 12/18/2024) for post op.      [1]  Current Outpatient Medications:    ALPRAZolam (XANAX) 0.25 MG tablet, Take 0.25 mg by mouth 2 (two) times daily as needed., Disp: , Rfl:    cephALEXin  (KEFLEX ) 500 MG capsule, Take 1 capsule (500 mg total) by mouth 3 (three) times daily., Disp: 30 capsule, Rfl: 2   CRANBERRY PO, Take 1 tablet by mouth daily., Disp: , Rfl:    D-MANNOSE PO, Take by mouth., Disp: , Rfl:    Milk Thistle 300 MG CAPS, Take 1 capsule by mouth daily in the afternoon., Disp: , Rfl:    nitrofurantoin , macrocrystal-monohydrate, (MACROBID ) 100 MG capsule, 1 capsule by mouth twice a day, Disp: 10 capsule, Rfl: 0   norgestrel-ethinyl estradiol (LO/OVRAL,CRYSELLE) 0.3-30 MG-MCG tablet, Take 1 tablet by mouth daily., Disp: , Rfl:    Probiotic Product (PROBIOTIC DAILY PO), Take 1 capsule by mouth daily in the afternoon., Disp: , Rfl:    ondansetron  (ZOFRAN ) 4 MG tablet, Take 1 tablet (4 mg total) by mouth every 8 (eight) hours as needed for nausea or vomiting. (Patient not taking: Reported on 11/27/2024), Disp: 20 tablet, Rfl: 0 [2]  Social History Tobacco Use  Smoking Status Never  Smokeless Tobacco Never  [3]  Allergies Allergen Reactions   Mushroom Extract Complex (Obsolete) Nausea And Vomiting   Sulfa Antibiotics Hives   "

## 2025-01-11 ENCOUNTER — Encounter: Admitting: Podiatry

## 2025-05-08 ENCOUNTER — Encounter: Admitting: Physician Assistant
